# Patient Record
Sex: Male | Born: 1955 | Hispanic: Yes | Smoking: Heavy tobacco smoker
Health system: Southern US, Community
[De-identification: ages and names within clinical notes are randomized; demographics above are authoritative.]

## PROBLEM LIST (undated history)

## (undated) DIAGNOSIS — I10 Essential (primary) hypertension: Secondary | ICD-10-CM

## (undated) DIAGNOSIS — F1910 Other psychoactive substance abuse, uncomplicated: Secondary | ICD-10-CM

---
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
## 2020-10-10 ENCOUNTER — Encounter (HOSPITAL_COMMUNITY): Payer: Self-pay | Admitting: Emergency Medicine

## 2020-10-10 ENCOUNTER — Emergency Department (HOSPITAL_COMMUNITY)
Admission: EM | Admit: 2020-10-10 | Discharge: 2020-10-11 | Disposition: A | Discharge status: Home / Self Care | Payer: Self-pay | Attending: Emergency Medicine | Admitting: Emergency Medicine

## 2020-10-10 ENCOUNTER — Other Ambulatory Visit: Payer: Self-pay

## 2020-10-10 DIAGNOSIS — Z5321 Procedure and treatment not carried out due to patient leaving prior to being seen by health care provider: Secondary | ICD-10-CM | POA: Insufficient documentation

## 2020-10-10 DIAGNOSIS — R55 Syncope and collapse: Secondary | ICD-10-CM | POA: Insufficient documentation

## 2020-10-10 LAB — CBC WITH DIFFERENTIAL/PLATELET
Abs Immature Granulocytes: 0.01 10*3/uL (ref 0.00–0.07)
Basophils Absolute: 0 10*3/uL (ref 0.0–0.1)
Basophils Relative: 1 %
Eosinophils Absolute: 0.1 10*3/uL (ref 0.0–0.5)
Eosinophils Relative: 2 %
HCT: 42 % (ref 39.0–52.0)
Hemoglobin: 13 g/dL (ref 13.0–17.0)
Immature Granulocytes: 0 %
Lymphocytes Relative: 27 %
Lymphs Abs: 1.8 10*3/uL (ref 0.7–4.0)
MCH: 25.9 pg — ABNORMAL LOW (ref 26.0–34.0)
MCHC: 31 g/dL (ref 30.0–36.0)
MCV: 83.8 fL (ref 80.0–100.0)
Monocytes Absolute: 0.5 10*3/uL (ref 0.1–1.0)
Monocytes Relative: 8 %
Neutro Abs: 4.2 10*3/uL (ref 1.7–7.7)
Neutrophils Relative %: 62 %
Platelets: 199 10*3/uL (ref 150–400)
RBC: 5.01 MIL/uL (ref 4.22–5.81)
RDW: 14.2 % (ref 11.5–15.5)
WBC: 6.6 10*3/uL (ref 4.0–10.5)
nRBC: 0 % (ref 0.0–0.2)

## 2020-10-10 LAB — COMPREHENSIVE METABOLIC PANEL
ALT: 31 U/L (ref 0–44)
AST: 33 U/L (ref 15–41)
Albumin: 4.1 g/dL (ref 3.5–5.0)
Alkaline Phosphatase: 75 U/L (ref 38–126)
Anion gap: 11 (ref 5–15)
BUN: 21 mg/dL (ref 8–23)
CO2: 25 mmol/L (ref 22–32)
Calcium: 9.1 mg/dL (ref 8.9–10.3)
Chloride: 105 mmol/L (ref 98–111)
Creatinine, Ser: 1.14 mg/dL (ref 0.61–1.24)
GFR, Estimated: >60 mL/min (ref >60)
Glucose, Bld: 114 mg/dL — ABNORMAL HIGH (ref 70–99)
Potassium: 3.8 mmol/L (ref 3.5–5.1)
Sodium: 141 mmol/L (ref 135–145)
Total Bilirubin: 0.4 mg/dL (ref 0.3–1.2)
Total Protein: 7.5 g/dL (ref 6.5–8.1)

## 2020-10-10 NOTE — ED Triage Notes (Signed)
Patient arrived with EMS from home reports brief syncopal episode at home this evening with mild dizziness/lightheaded , CBG= 127 , alert and oriented, denies pain /respirations unlabored.

## 2020-10-11 NOTE — ED Notes (Signed)
Patient called x 2 with no response. 

## 2020-10-12 ENCOUNTER — Encounter (HOSPITAL_COMMUNITY): Payer: Self-pay | Admitting: Emergency Medicine

## 2020-10-12 ENCOUNTER — Observation Stay (HOSPITAL_COMMUNITY): Payer: Self-pay

## 2020-10-12 ENCOUNTER — Inpatient Hospital Stay (HOSPITAL_COMMUNITY)
Admission: EM | Admit: 2020-10-12 | Discharge: 2020-10-15 | DRG: 312 | Disposition: A | Discharge status: Home / Self Care | Payer: Self-pay | Attending: Family Medicine | Admitting: Family Medicine

## 2020-10-12 ENCOUNTER — Emergency Department (HOSPITAL_COMMUNITY): Payer: Self-pay

## 2020-10-12 ENCOUNTER — Other Ambulatory Visit: Payer: Self-pay

## 2020-10-12 ENCOUNTER — Ambulatory Visit (HOSPITAL_COMMUNITY)
Admission: RE | Admit: 2020-10-12 | Discharge: 2020-10-12 | Disposition: A | Discharge status: Home / Self Care | Payer: Self-pay | Source: Ambulatory Visit

## 2020-10-12 DIAGNOSIS — F1027 Alcohol dependence with alcohol-induced persisting dementia: Secondary | ICD-10-CM | POA: Diagnosis present

## 2020-10-12 DIAGNOSIS — F17210 Nicotine dependence, cigarettes, uncomplicated: Secondary | ICD-10-CM | POA: Diagnosis present

## 2020-10-12 DIAGNOSIS — R7689 Other specified abnormal immunological findings in serum: Secondary | ICD-10-CM

## 2020-10-12 DIAGNOSIS — F0390 Unspecified dementia without behavioral disturbance: Secondary | ICD-10-CM | POA: Diagnosis present

## 2020-10-12 DIAGNOSIS — R413 Other amnesia: Secondary | ICD-10-CM | POA: Diagnosis present

## 2020-10-12 DIAGNOSIS — F0150 Vascular dementia without behavioral disturbance: Secondary | ICD-10-CM | POA: Diagnosis present

## 2020-10-12 DIAGNOSIS — F1911 Other psychoactive substance abuse, in remission: Secondary | ICD-10-CM

## 2020-10-12 DIAGNOSIS — R55 Syncope and collapse: Principal | ICD-10-CM | POA: Diagnosis present

## 2020-10-12 DIAGNOSIS — Z20822 Contact with and (suspected) exposure to covid-19: Secondary | ICD-10-CM | POA: Diagnosis present

## 2020-10-12 DIAGNOSIS — B1920 Unspecified viral hepatitis C without hepatic coma: Secondary | ICD-10-CM | POA: Diagnosis present

## 2020-10-12 DIAGNOSIS — R768 Other specified abnormal immunological findings in serum: Secondary | ICD-10-CM

## 2020-10-12 DIAGNOSIS — E785 Hyperlipidemia, unspecified: Secondary | ICD-10-CM | POA: Diagnosis present

## 2020-10-12 DIAGNOSIS — E538 Deficiency of other specified B group vitamins: Secondary | ICD-10-CM | POA: Diagnosis present

## 2020-10-12 DIAGNOSIS — I10 Essential (primary) hypertension: Secondary | ICD-10-CM | POA: Diagnosis present

## 2020-10-12 DIAGNOSIS — Z8249 Family history of ischemic heart disease and other diseases of the circulatory system: Secondary | ICD-10-CM

## 2020-10-12 DIAGNOSIS — Z0000 Encounter for general adult medical examination without abnormal findings: Secondary | ICD-10-CM

## 2020-10-12 HISTORY — DX: Other psychoactive substance abuse, uncomplicated: F19.10

## 2020-10-12 HISTORY — DX: Syncope and collapse: R55

## 2020-10-12 LAB — HEPATIC FUNCTION PANEL
ALT: 30 U/L (ref 0–44)
AST: 34 U/L (ref 15–41)
Albumin: 3.9 g/dL (ref 3.5–5.0)
Alkaline Phosphatase: 67 U/L (ref 38–126)
Bilirubin, Direct: 0.1 mg/dL (ref 0.0–0.2)
Indirect Bilirubin: 0.6 mg/dL (ref 0.3–0.9)
Total Bilirubin: 0.7 mg/dL (ref 0.3–1.2)
Total Protein: 7.3 g/dL (ref 6.5–8.1)

## 2020-10-12 LAB — CBC
HCT: 42.7 % (ref 39.0–52.0)
Hemoglobin: 13.4 g/dL (ref 13.0–17.0)
MCH: 26.4 pg (ref 26.0–34.0)
MCHC: 31.4 g/dL (ref 30.0–36.0)
MCV: 84.1 fL (ref 80.0–100.0)
Platelets: 216 10*3/uL (ref 150–400)
RBC: 5.08 MIL/uL (ref 4.22–5.81)
RDW: 14.2 % (ref 11.5–15.5)
WBC: 5.6 10*3/uL (ref 4.0–10.5)
nRBC: 0 % (ref 0.0–0.2)

## 2020-10-12 LAB — VITAMIN B12: Vitamin B-12: 204 pg/mL (ref 180–914)

## 2020-10-12 LAB — BASIC METABOLIC PANEL
Anion gap: 11 (ref 5–15)
BUN: 9 mg/dL (ref 8–23)
CO2: 25 mmol/L (ref 22–32)
Calcium: 9.2 mg/dL (ref 8.9–10.3)
Chloride: 103 mmol/L (ref 98–111)
Creatinine, Ser: 1.11 mg/dL (ref 0.61–1.24)
GFR, Estimated: >60 mL/min (ref >60)
Glucose, Bld: 109 mg/dL — ABNORMAL HIGH (ref 70–99)
Potassium: 3.6 mmol/L (ref 3.5–5.1)
Sodium: 139 mmol/L (ref 135–145)

## 2020-10-12 LAB — URINALYSIS, ROUTINE W REFLEX MICROSCOPIC
Bilirubin Urine: NEGATIVE
Glucose, UA: NEGATIVE mg/dL
Hgb urine dipstick: NEGATIVE
Ketones, ur: NEGATIVE mg/dL
Leukocytes,Ua: NEGATIVE
Nitrite: NEGATIVE
Protein, ur: NEGATIVE mg/dL
Specific Gravity, Urine: 1.003 — ABNORMAL LOW (ref 1.005–1.030)
pH: 6 (ref 5.0–8.0)

## 2020-10-12 LAB — RAPID URINE DRUG SCREEN, HOSP PERFORMED
Amphetamines: NOT DETECTED
Barbiturates: NOT DETECTED
Benzodiazepines: NOT DETECTED
Cocaine: NOT DETECTED
Opiates: NOT DETECTED
Tetrahydrocannabinol: NOT DETECTED

## 2020-10-12 LAB — HEPATITIS PANEL, ACUTE
HCV Ab: REACTIVE — AB
Hep A IgM: NONREACTIVE
Hep B C IgM: NONREACTIVE
Hepatitis B Surface Ag: NONREACTIVE

## 2020-10-12 LAB — RESPIRATORY PANEL BY RT PCR (FLU A&B, COVID)
Influenza A by PCR: NEGATIVE
Influenza B by PCR: NEGATIVE
SARS Coronavirus 2 by RT PCR: NEGATIVE

## 2020-10-12 LAB — ETHANOL: Alcohol, Ethyl (B): <10 mg/dL (ref <10)

## 2020-10-12 LAB — TROPONIN I (HIGH SENSITIVITY)
Troponin I (High Sensitivity): 3 ng/L (ref <18)
Troponin I (High Sensitivity): 3 ng/L (ref <18)

## 2020-10-12 LAB — HIV ANTIBODY (ROUTINE TESTING W REFLEX): HIV Screen 4th Generation wRfx: NONREACTIVE

## 2020-10-12 MED ORDER — ACETAMINOPHEN 325 MG PO TABS
650.0000 mg | ORAL_TABLET | Freq: Four times a day (QID) | ORAL | Status: DC | PRN
  Administered 2020-10-14 – 2020-10-15 (×2): 650 mg via ORAL
  Filled 2020-10-12 (×2): qty 2

## 2020-10-12 MED ORDER — ACETAMINOPHEN 650 MG RE SUPP: 650.0000 mg | Freq: Four times a day (QID) | RECTAL | Status: DC | PRN

## 2020-10-12 MED ORDER — SODIUM CHLORIDE 0.9% FLUSH
3.0000 mL | Freq: Two times a day (BID) | INTRAVENOUS | Status: DC
  Administered 2020-10-12 – 2020-10-15 (×6): 3 mL via INTRAVENOUS

## 2020-10-12 MED ORDER — ADULT MULTIVITAMIN W/MINERALS CH
1.0000 | ORAL_TABLET | Freq: Every day | ORAL | Status: DC
  Administered 2020-10-13 – 2020-10-15 (×3): 1 via ORAL
  Filled 2020-10-12 (×3): qty 1

## 2020-10-12 MED ORDER — POLYETHYLENE GLYCOL 3350 17 G PO PACK: 17.0000 g | PACK | Freq: Every day | ORAL | Status: DC | PRN

## 2020-10-12 MED ORDER — ENOXAPARIN SODIUM 40 MG/0.4ML ~~LOC~~ SOLN
40.0000 mg | SUBCUTANEOUS | Status: DC
  Administered 2020-10-12 – 2020-10-14 (×3): 40 mg via SUBCUTANEOUS
  Filled 2020-10-12 (×3): qty 0.4

## 2020-10-12 MED ORDER — AMLODIPINE BESYLATE 5 MG PO TABS
5.0000 mg | ORAL_TABLET | Freq: Every day | ORAL | Status: DC
  Administered 2020-10-12 – 2020-10-14 (×3): 5 mg via ORAL
  Filled 2020-10-12 (×3): qty 1

## 2020-10-12 MED ORDER — NICOTINE 14 MG/24HR TD PT24
14.0000 mg | MEDICATED_PATCH | Freq: Every day | TRANSDERMAL | Status: DC
  Administered 2020-10-12 – 2020-10-15 (×4): 14 mg via TRANSDERMAL
  Filled 2020-10-12 (×4): qty 1

## 2020-10-12 NOTE — ED Triage Notes (Signed)
Pt presents with memory loss and episode of fainting with loss of consciousness on Monday. States called EMS and was transported to [facility] but left due to wait time.   Patient is being discharged from the Urgent Care and sent to the Emergency Department via POV . Per Dr Tracie Harrier, patient is in need of higher level of care due to need of further imaging. Patient is aware and verbalizes understanding of plan of care.  Vitals:   10/12/20 1226  BP: 129/80  Pulse: 66  Resp: 17  Temp: 98.2 F (36.8 C)  SpO2: 100%
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
## 2020-10-12 NOTE — ED Provider Notes (Signed)
[facility] EMERGENCY DEPARTMENT Provider Note   CSN: [id number] Arrival date & time: 10/12/20  1250     History Chief Complaint  Patient presents with  . Loss of Consciousness    Rene Pacismael Allene Dillonorres is a 64 y.o. male with past medical history of hypertension that presents the emerge department today with ex-wife for syncopal episode on Monday night.  Came to the emergency department Monday night, but  left due to wait time, went to urgent care today who advised him to come to the emergency department for further evaluation.  When speaking to patient and asking him why he is here, he states that he is unsure.  Ex-wife who is present states that he has been suffering from memory loss for the past year, has not seen a doctor in the past 12 years.  Patient denies pain anywhere, is a&o x4.  He is able to answer questions appropriately, however does jump to saying I do not know before being able to respond appropriately.  Ex-Wife States [place] here from [place] a couple months ago, has been living with her since then.  States that patient has been having these syncopal episodes for the past year, has about 4-5 so far, last 1 being Monday night.  Most of them have been unwitnessed except for the 1 Monday night.  Ex-wife states that he had slumped over and was nonresponsive for a couple of minutes, states that he was not back to baseline for over 10 minutes.  States that during the time his  R fist was clenched and arm was extended.  States that he kept yawning after this happened and was still unresponsive for the next 10 minutes.  When speaking to patient about this he states that he does not remember this, however states that this is happened to him a couple times in the past year, did hit his head during those times, however on Monday ex-wife states that he was sitting in a chair and did not hit his head.  Patient is not complaining of anything currently, no headache, vision  changes, numbness, tingling.  No facial droop, speech difficulty , weakness after coming back to baseline.  No tonic-clonic jerks.  Patient has never been diagnosed with seizures before, no seizure history running in the family.  Denies any alcohol use, IV drug use.  Denies any fevers, neck pain, confusion.  Memory loss has been occurring for the past year.  No history of dementia.  HPI     Past Medical History:  Diagnosis Date  . Hypertension     There are no problems to display for this patient.   History reviewed. No pertinent surgical history.     No family history on file.  Social History   Tobacco Use  . Smoking status: Never Smoker  . Smokeless tobacco: Never Used  Substance Use Topics  . Alcohol use: Never  . Drug use: Never    Home Medications Prior to Admission medications   Not on File    Allergies    Patient has no known allergies.  Review of Systems   Review of Systems  Constitutional: Negative for chills, diaphoresis, fatigue and fever.  HENT: Negative for congestion, sore throat and trouble swallowing.   Eyes: Negative for pain and visual disturbance.  Respiratory: Negative for cough, shortness of breath and wheezing.   Cardiovascular: Negative for chest pain, palpitations and leg swelling.  Gastrointestinal: Negative for abdominal distention, abdominal pain, diarrhea, nausea and vomiting.  Genitourinary: Negative for difficulty urinating.  Musculoskeletal: Negative for back pain, neck pain and neck stiffness.  Skin: Negative for pallor.  Neurological: Positive for syncope. Negative for dizziness, speech difficulty, weakness, light-headedness, numbness and headaches.  Psychiatric/Behavioral: Negative for confusion.    Physical Exam Updated Vital Signs BP (!) 160/105 (BP Location: Right Arm)   Pulse 61   Temp 98.7 F (37.1 C) (Oral)   Resp 18   Ht 5\' 7"  (1.702 m)   Wt 70.3 kg   SpO2 100%   BMI 24.28 kg/m   Physical Exam Constitutional:       General: He is not in acute distress.    Appearance: Normal appearance. He is not ill-appearing, toxic-appearing or diaphoretic.  HENT:     Mouth/Throat:     Mouth: Mucous membranes are moist.     Pharynx: Oropharynx is clear.  Eyes:     General: No scleral icterus.    Extraocular Movements: Extraocular movements intact.     Pupils: Pupils are equal, round, and reactive to light.  Cardiovascular:     Rate and Rhythm: Normal rate and regular rhythm.     Pulses: Normal pulses.     Heart sounds: Normal heart sounds.  Pulmonary:     Effort: Pulmonary effort is normal. No respiratory distress.     Breath sounds: Normal breath sounds. No stridor. No wheezing, rhonchi or rales.  Chest:     Chest wall: No tenderness.  Abdominal:     General: Abdomen is flat. There is no distension.     Palpations: Abdomen is soft.     Tenderness: There is no abdominal tenderness. There is no guarding or rebound.  Musculoskeletal:        General: No swelling or tenderness. Normal range of motion.     Cervical back: Normal range of motion and neck supple. No rigidity.     Right lower leg: No edema.     Left lower leg: No edema.  Skin:    General: Skin is warm and dry.     Capillary Refill: Capillary refill takes less than 2 seconds.     Coloration: Skin is not pale.  Neurological:     General: No focal deficit present.     Mental Status: He is alert and oriented to person, place, and time.     Comments: Alert. Clear speech. No facial droop. CNIII-XII grossly intact. Bilateral upper and lower extremities' sensation grossly intact. 5/5 symmetric strength with grip strength and with plantar and dorsi flexion bilaterally. Normal finger to nose bilaterally. Negative pronator drift. Negative Romberg sign. Gait is steady and intact    Psychiatric:        Mood and Affect: Mood normal.        Behavior: Behavior normal.     ED Results / Procedures / Treatments   Labs (all labs ordered are listed, but  only abnormal results are displayed) Labs Reviewed  BASIC METABOLIC PANEL - Abnormal; Notable for the following components:      Result Value   Glucose, Bld 109 (*)    All other components within normal limits  URINALYSIS, ROUTINE W REFLEX MICROSCOPIC - Abnormal; Notable for the following components:   Color, Urine STRAW (*)    Specific Gravity, Urine 1.003 (*)    All other components within normal limits  RESPIRATORY PANEL BY RT PCR (FLU A&B, COVID)  CBC  RAPID URINE DRUG SCREEN, HOSP PERFORMED  HEPATIC FUNCTION PANEL  CBG MONITORING, ED  TROPONIN I (  HIGH SENSITIVITY)    EKG EKG Interpretation  Date/Time:  Wednesday October 12 2020 12:56:01 EDT Ventricular Rate:  69 PR Interval:  144 QRS Duration: 92 QT Interval:  392 QTC Calculation: 420 R Axis:   16 Text Interpretation: Normal sinus rhythm Cannot rule out Anterior infarct , age undetermined Confirmed by Virgina Norfolk [phone]) on 10/12/2020 2:11:38 PM Also confirmed by Virgina Norfolk [phone])  on 10/12/2020 2:11:55 PM   Radiology DG Chest 2 View  Result Date: 10/12/2020 CLINICAL DATA:  Syncope, cough EXAM: CHEST - 2 VIEW COMPARISON:  None. FINDINGS: Lungs are well expanded, symmetric, and clear. No pneumothorax or pleural effusion. Cardiac size within normal limits. Pulmonary vascularity is normal. Osseous structures are age-appropriate. No acute bone abnormality. IMPRESSION: No active cardiopulmonary disease. Electronically Signed   By: Helyn Numbers MD   On: 10/12/2020 14:33   CT HEAD WO CONTRAST  Result Date: 10/12/2020 CLINICAL DATA:  Delirium.  Passed out and hit back of head EXAM: CT HEAD WITHOUT CONTRAST TECHNIQUE: Contiguous axial images were obtained from the base of the skull through the vertex without intravenous contrast. COMPARISON:  None. FINDINGS: Brain: No evidence of acute infarction, hemorrhage, hydrocephalus, extra-axial collection or mass lesion/mass effect. Vascular: Calcific atherosclerosis. Skull: No  acute fracture. Sinuses/Orbits: The sinuses are clear.  Unremarkable orbits. Other: No mastoid effusions. Cerumen in the right external auditory canal. IMPRESSION: No evidence of acute intracranial abnormality. Electronically Signed   By: Feliberto Harts MD   On: 10/12/2020 14:18    Procedures Procedures (including critical care time)  Medications Ordered in ED Medications - No data to display  ED Course  I have reviewed the triage vital signs and the nursing notes.  Pertinent labs & imaging results that were available during my care of the patient were reviewed by me and considered in my medical decision making (see chart for details).    MDM Rules/Calculators/A&P                         Cayetano Ginley is a 64 y.o. male with past medical history of hypertension that presents the emerge department today with ex-wife for syncopal episode on Monday night.  Will obtain work-up for syncopal episode, patient has normal neuro exam.  Description of syncopal episode questionable for seizure, but could also be cardiac in origin, however I think patient most likely needs further evaluation for recurrent syncopal episodes including echo and tele.   Work-up today with unremarkable BMP, CBC and urinalysis.  EKG interpreted by me with no acute ischemia or arrhythmia, chest x-ray interpreted me with no acute cardiopulmonary disease.  CT head without any pathology.  Will consult hospitalist this time for unknown syncope work-up.  330 spoke to Dr. Robyne Peers, family medicine resident that will accept the patient.  The patient appears reasonably stabilized for admission considering the current resources, flow, and capabilities available in the ED at this time, and I doubt any other [facility] requiring further screening and/or treatment in the ED prior to admission.  I discussed this case with my attending physician who cosigned this note including patient's presenting symptoms, physical exam, and planned diagnostics  and interventions. Attending physician stated agreement with plan or made changes to plan which were implemented.   Attending physician assessed patient at bedside.    Final Clinical Impression(s) / ED Diagnoses Final diagnoses:  Syncope, unspecified syncope type    Rx / DC Orders ED Discharge Orders    None  Farrel Gordon, PA-C 10/12/20 1534    Virgina Norfolk, DO 10/12/20 1538

## 2020-10-12 NOTE — ED Triage Notes (Signed)
Pt was seen here Monday night for syncopal episode, wife states that patient became unresponsive and began vomiting. States pt was out approx 5-10 mins, EMS brought patient in to ED but never seen by doctor due to wait time. Went to urgent care today who sent patient down for further evaluation. Pt alert, oriented to person, place disoriented to time.

## 2020-10-12 NOTE — ED Notes (Signed)
Admitting at bedside 

## 2020-10-13 ENCOUNTER — Observation Stay (HOSPITAL_COMMUNITY): Payer: Self-pay

## 2020-10-13 ENCOUNTER — Encounter (HOSPITAL_COMMUNITY): Payer: Self-pay | Admitting: Family Medicine

## 2020-10-13 DIAGNOSIS — R768 Other specified abnormal immunological findings in serum: Secondary | ICD-10-CM

## 2020-10-13 DIAGNOSIS — I340 Nonrheumatic mitral (valve) insufficiency: Secondary | ICD-10-CM

## 2020-10-13 DIAGNOSIS — F0390 Unspecified dementia without behavioral disturbance: Secondary | ICD-10-CM | POA: Diagnosis present

## 2020-10-13 DIAGNOSIS — R413 Other amnesia: Secondary | ICD-10-CM | POA: Diagnosis present

## 2020-10-13 DIAGNOSIS — I361 Nonrheumatic tricuspid (valve) insufficiency: Secondary | ICD-10-CM

## 2020-10-13 DIAGNOSIS — R55 Syncope and collapse: Secondary | ICD-10-CM

## 2020-10-13 DIAGNOSIS — F1027 Alcohol dependence with alcohol-induced persisting dementia: Secondary | ICD-10-CM | POA: Diagnosis present

## 2020-10-13 DIAGNOSIS — I10 Essential (primary) hypertension: Secondary | ICD-10-CM

## 2020-10-13 HISTORY — DX: Essential (primary) hypertension: I10

## 2020-10-13 LAB — ECHOCARDIOGRAM COMPLETE
Area-P 1/2: 1.98 cm2
Height: 67 in
S' Lateral: 3.7 cm
Weight: 2480 oz

## 2020-10-13 LAB — COMPREHENSIVE METABOLIC PANEL
ALT: 25 U/L (ref 0–44)
AST: 26 U/L (ref 15–41)
Albumin: 3.5 g/dL (ref 3.5–5.0)
Alkaline Phosphatase: 69 U/L (ref 38–126)
Anion gap: 7 (ref 5–15)
BUN: 10 mg/dL (ref 8–23)
CO2: 26 mmol/L (ref 22–32)
Calcium: 8.7 mg/dL — ABNORMAL LOW (ref 8.9–10.3)
Chloride: 105 mmol/L (ref 98–111)
Creatinine, Ser: 1.02 mg/dL (ref 0.61–1.24)
GFR, Estimated: >60 mL/min (ref >60)
Glucose, Bld: 99 mg/dL (ref 70–99)
Potassium: 3.9 mmol/L (ref 3.5–5.1)
Sodium: 138 mmol/L (ref 135–145)
Total Bilirubin: 0.7 mg/dL (ref 0.3–1.2)
Total Protein: 6.7 g/dL (ref 6.5–8.1)

## 2020-10-13 LAB — RPR: RPR Ser Ql: NONREACTIVE

## 2020-10-13 LAB — CBC
HCT: 38.6 % — ABNORMAL LOW (ref 39.0–52.0)
Hemoglobin: 12.2 g/dL — ABNORMAL LOW (ref 13.0–17.0)
MCH: 26.2 pg (ref 26.0–34.0)
MCHC: 31.6 g/dL (ref 30.0–36.0)
MCV: 82.8 fL (ref 80.0–100.0)
Platelets: 181 10*3/uL (ref 150–400)
RBC: 4.66 MIL/uL (ref 4.22–5.81)
RDW: 14.2 % (ref 11.5–15.5)
WBC: 5.7 10*3/uL (ref 4.0–10.5)
nRBC: 0 % (ref 0.0–0.2)

## 2020-10-13 LAB — LIPID PANEL
Cholesterol: 177 mg/dL (ref 0–200)
HDL: 43 mg/dL (ref >40)
LDL Cholesterol: 121 mg/dL — ABNORMAL HIGH (ref 0–99)
Total CHOL/HDL Ratio: 4.1 RATIO
Triglycerides: 67 mg/dL (ref <150)
VLDL: 13 mg/dL (ref 0–40)

## 2020-10-13 LAB — VITAMIN B12: Vitamin B-12: 184 pg/mL (ref 180–914)

## 2020-10-13 LAB — AMMONIA: Ammonia: 34 umol/L (ref 9–35)

## 2020-10-13 LAB — TSH: TSH: 1.689 u[IU]/mL (ref 0.350–4.500)

## 2020-10-13 LAB — FOLATE: Folate: 16.5 ng/mL (ref >5.9)

## 2020-10-13 NOTE — Progress Notes (Signed)
Family Medicine Teaching Service Daily Progress Note Intern Pager: [phone]  Patient name: Chad Daniel Medical record number: [id number] Date of birth: February 21, 1956 Age: 64 y.o. Gender: male  Primary Care Provider: Patient, No Pcp Per Consultants: Neurology Code Status: Full  Pt Overview and Major Events to Date:  Admitted 10/20  Assessment and Plan: Chad Daniel is a 64 y.o. male presenting with syncope. PMH is significant for hypertension.  Syncopal episode  Patient presents with history of multiple syncopal episodes over the last year or 2, most recently about 2 days ago involving a witnessed episode of slumping in the chair while getting a haircut lasting about 10 minutes. CBC, BMP and UA unremarkable. CXR showed no active cardiopulmonary disease. CT unremarlable. MRI normal.  EKG NSR. Tropnonin's negative. Patient not hypoglycemic or uremic.  Patient with remote history of substance abuse however UDS was negative. Neurology consulted, appreciate recs.  -am CBC and CMP - EEG normal  - f/u echo results -Follow-up orthostatic vitals  Memory Loss Per ex-wife, patient with rapid decline in the patient's memory since June/July.  Patient admits he has been forgetful and often forgets things like where to put stuff. A&Ox3. CT head was negative for acute intracranial process.  Given patient's reported diet, consider nutritional deficiencies. Concern for vascular dementia given these multiple episodes and rapid confusion that has worsened in the past 3-4 months. MRI normal.  - Neurology consulted, appreciate recs  - folate  - Vit B12 204. MMA ordered confirm -Multivitamin  HTN Patient has not seen a physician in over 12 years. Currently not on any home medications. BP on admission is 160/105. Used to be on BP meds but stopped taking them over a year ago.  - Start amlodipine 5 mg daily   Tobacco use  Reports smoking 1 pack every couple of days.   -nicotine patch 14 mg  History  of substance abuse Patient reports abstinence for greater than 10 years.  Admits to IV drug use in the past.  UDS negative. - Hep C positive. F/u NAAT   FEN/GI: heart healthy diet  Prophylaxis: Lovenox   Disposition: cardiac telemetry   Subjective:   No acute events overnight. Patient getting tearful when talking to me stating he is worried about these episodes of passing out and does not want to fall and hit his head. He states he stopped taking his BP medication over a year ago because he felt he was doing ok without it. Denies dizziness, lightheadedness, chest pain, SOB.   Objective: Temp:  [98.7 F (37.1 C)] 98.7 F (37.1 C) (10/20 1254) Pulse Rate:  [49-61] 57 (10/21 0400) Resp:  [14-19] 15 (10/21 0400) BP: (109-160)/(70-105) 128/77 (10/21 0400) SpO2:  [98 %-100 %] 99 % (10/21 0400) Weight:  [70.3 kg] 70.3 kg (10/20 1254) Physical Exam: General: Pleasant, NAD Cardiovascular: RRR. No murmurs  Respiratory: CTAB. Normal WOB Abdomen: soft, non distended, non tender  Extremities: warm, dry. No edema. Distal pulses 2+  Laboratory: Recent Labs  Lab 10/10/20 1951 10/12/20 1308 10/13/20 0218  WBC 6.6 5.6 5.7  HGB 13.0 13.4 12.2*  HCT 42.0 42.7 38.6*  PLT 199 216 181   Recent Labs  Lab 10/10/20 1951 10/12/20 1308 10/12/20 1619 10/13/20 0218  NA 141 139  --  138  K 3.8 3.6  --  3.9  CL 105 103  --  105  CO2 25 25  --  26  BUN 21 9  --  10  CREATININE 1.14 1.11  --  1.02  CALCIUM 9.1 9.2  --  8.7*  PROT 7.5  --  7.3 6.7  BILITOT 0.4  --  0.7 0.7  ALKPHOS 75  --  67 69  ALT 31  --  30 25  AST 33  --  34 26  GLUCOSE 114* 109*  --  99     Imaging/Diagnostic Tests:  DG Chest 2 View Result Date: 10/12/2020 No active cardiopulmonary disease.   CT HEAD WO CONTRAST Result Date: 10/12/2020  No evidence of acute intracranial abnormality.  MR BRAIN WO CONTRAST Result Date: 10/12/2020  Normal for age noncontrast MRI appearance of the brain.    Cora Collum, DO 10/13/2020, 5:31 AM PGY-1, [place] [facility] FPTS Intern pager: [phone], text pages welcome

## 2020-10-13 NOTE — Progress Notes (Signed)
  Echocardiogram 2D Echocardiogram has been performed.  Pieter Partridge 10/13/2020, 10:32 AM

## 2020-10-13 NOTE — Plan of Care (Signed)
  Problem: Education: Goal: Knowledge of condition and prescribed therapy will improve Outcome: Progressing   Problem: Cardiac: Goal: Will achieve and/or maintain adequate cardiac output Outcome: Progressing   Problem: Physical Regulation: Goal: Complications related to the disease process, condition or treatment will be avoided or minimized Outcome: Progressing   Problem: Education: Goal: Expressions of having a comfortable level of knowledge regarding the disease process will increase Outcome: Progressing   Problem: Coping: Goal: Ability to adjust to condition or change in health will improve Outcome: Progressing Goal: Ability to identify appropriate support needs will improve Outcome: Progressing   Problem: Health Behavior/Discharge Planning: Goal: Compliance with prescribed medication regimen will improve Outcome: Progressing   Problem: Medication: Goal: Risk for medication side effects will decrease Outcome: Progressing   Problem: Clinical Measurements: Goal: Complications related to the disease process, condition or treatment will be avoided or minimized Outcome: Progressing Goal: Diagnostic test results will improve Outcome: Progressing   Problem: Safety: Goal: Verbalization of understanding the information provided will improve Outcome: Progressing   Problem: Self-Concept: Goal: Level of anxiety will decrease Outcome: Progressing Goal: Ability to verbalize feelings about condition will improve Outcome: Progressing   Problem: Education: Goal: Knowledge of General Education information will improve Description: Including pain rating scale, medication(s)/side effects and non-pharmacologic comfort measures Outcome: Progressing   Problem: Health Behavior/Discharge Planning: Goal: Ability to manage health-related needs will improve Outcome: Progressing   Problem: Clinical Measurements: Goal: Ability to maintain clinical measurements within normal limits will  improve Outcome: Progressing Goal: Will remain free from infection Outcome: Progressing Goal: Diagnostic test results will improve Outcome: Progressing Goal: Respiratory complications will improve Outcome: Progressing Goal: Cardiovascular complication will be avoided Outcome: Progressing   Problem: Activity: Goal: Risk for activity intolerance will decrease Outcome: Progressing   Problem: Nutrition: Goal: Adequate nutrition will be maintained Outcome: Progressing   Problem: Coping: Goal: Level of anxiety will decrease Outcome: Progressing   Problem: Elimination: Goal: Will not experience complications related to bowel motility Outcome: Progressing Goal: Will not experience complications related to urinary retention Outcome: Progressing   Problem: Pain Managment: Goal: General experience of comfort will improve Outcome: Progressing   Problem: Safety: Goal: Ability to remain free from injury will improve Outcome: Progressing   Problem: Skin Integrity: Goal: Risk for impaired skin integrity will decrease Outcome: Progressing

## 2020-10-13 NOTE — Progress Notes (Signed)
EEG complete - results pending 

## 2020-10-13 NOTE — Progress Notes (Signed)
Lab called they reported did not need further blood collection had drawn all the blood needed for labs and have resulted them.

## 2020-10-13 NOTE — Procedures (Signed)
Patient Name: Chad Daniel  MRN: [id number]  Epilepsy Attending: Charlsie Quest  Referring Physician/Provider:  Dr Katha Cabal Date: 1021/2021 Duration: 25.39 mins  Patient history: 64 year old male with syncope.  EEG to evaluate for seizures.  Level of alertness: Awake  AEDs during EEG study: None  Technical aspects: This EEG study was done with scalp electrodes positioned according to the 10-20 International system of electrode placement. Electrical activity was acquired at a sampling rate of 500Hz  and reviewed with a high frequency filter of 70Hz  and a low frequency filter of 1Hz . EEG data were recorded continuously and digitally stored.   Description: The posterior dominant rhythm consists of 8 Hz activity of moderate voltage (25-35 uV) seen predominantly in posterior head regions, symmetric and reactive to eye opening and eye closing. Hyperventilation and photic stimulation were not performed.     IMPRESSION: This study is within normal limits. No seizures or epileptiform discharges were seen throughout the recording.  Chad Daniel 

## 2020-10-13 NOTE — Consult Note (Addendum)
Neurology Consultation  Reason for Consult: Syncope versus seizure and memory loss Referring Physician: McDiarmid, Leighton Roach, MD  CC: Syncopal episode  History is obtained from: Wife and daughter  HPI: Krist Rosenboom is a 64 y.o. male history of IV drug abuse multiple years ago along with essential hypertension.  Per daughter this past Monday patient was getting a haircut when he passed out and hit his head on the table.  There was associated vomiting.  During this period of time it was noted that he was breathing very heavy.  Apparently he was talking right away but seemed confused for approximately 8 minutes.  Although patient is not a good historian he remembers that he did live and [place] and stated during that period of time he feels as though he is passed out 2 or maybe 3 times previously.  Another concern family had is that over the past year he is seem to significant memory problems.  These include forgetting to eat, forgetting to shower, forgetting take medications.  Due to these issues he was moved from [place] to [place] to be watched more closely.  Wife states that over the past 6 months his memory has significantly declined needing for her to take over finances.   No family history of dementia.  Daughter in the room did not know much about the family history and neither did the patient. He does endorse doing a lot of drugs in his younger years but denies any drug abuse recently.  Denies alcohol use.  Endorses tobacco smoking.  Neurological consultation obtained for the syncopal episode as well as memory issues as the patient does not have insurance and outpatient follow-up was not reliable.  According to his ex-wife who he is living with, he has not seen a doctor in at least the last 10 to 15 years.  Patient denies any psychiatric illness in the past.  But he is an unreliable historian.  Also unable to obtain any family history of psychiatric disease.   Past Medical  History:  Diagnosis Date  . Essential hypertension 10/13/2020  . IV drug abuse, History of     Family History  Problem Relation Age of Onset  . Hypertension Mother   . Hypertension Father      Social History:   reports that he has never smoked. He has never used smokeless tobacco. He reports that he does not drink alcohol and does not use drugs.  Medications  Current Facility-Administered Medications:  .  acetaminophen (TYLENOL) tablet 650 mg, 650 mg, Oral, Q6H PRN **OR** acetaminophen (TYLENOL) suppository 650 mg, 650 mg, Rectal, Q6H PRN, Ganta, Anupa, DO .  amLODipine (NORVASC) tablet 5 mg, 5 mg, Oral, Daily, Ganta, Anupa, DO, 5 mg at 10/13/20 1053 .  enoxaparin (LOVENOX) injection 40 mg, 40 mg, Subcutaneous, Q24H, Ganta, Anupa, DO, 40 mg at 10/12/20 1852 .  multivitamin with minerals tablet 1 tablet, 1 tablet, Oral, Daily, Ganta, Anupa, DO, 1 tablet at 10/13/20 1052 .  nicotine (NICODERM CQ - dosed in mg/24 hours) patch 14 mg, 14 mg, Transdermal, Daily, Brimage, Vondra, DO, 14 mg at 10/13/20 1050 .  polyethylene glycol (MIRALAX / GLYCOLAX) packet 17 g, 17 g, Oral, Daily PRN, Ganta, Anupa, DO .  sodium chloride flush (NS) 0.9 % injection 3 mL, 3 mL, Intravenous, Q12H, Ganta, Anupa, DO, 3 mL at 10/13/20 1040 No current outpatient medications on file.  ROS:  General ROS: negative for - chills, fatigue, fever, night sweats, weight gain or weight loss Psychological  ROS: Positive for - , memory difficulties Ophthalmic ROS: negative for - blurry vision, double vision, eye pain or loss of vision ENT ROS: negative for - epistaxis, nasal discharge, oral lesions, sore throat, tinnitus or vertigo Allergy and Immunology ROS: negative for - hives or itchy/watery eyes Hematological and Lymphatic ROS: negative for - bleeding problems, bruising or swollen lymph nodes Endocrine ROS: negative for - galactorrhea, hair pattern changes, polydipsia/polyuria or temperature intolerance Respiratory ROS:  negative for - cough, hemoptysis, shortness of breath or wheezing Cardiovascular ROS: negative for - chest pain, dyspnea on exertion, edema or irregular heartbeat Gastrointestinal ROS: negative for - abdominal pain, diarrhea, hematemesis, nausea/vomiting or stool incontinence Genito-Urinary ROS: negative for - dysuria, hematuria, incontinence or urinary frequency/urgency Musculoskeletal ROS: negative for - joint swelling or muscular weakness Neurological ROS: as noted in HPI Dermatological ROS: negative for rash and skin lesion changes  Exam: Current vital signs: BP 128/89   Pulse 87   Temp 98.7 F (37.1 C) (Oral)   Resp 18   Ht 5\' 7"  (1.702 m)   Wt 70.3 kg   SpO2 98%   BMI 24.28 kg/m  Vital signs in last 24 hours: Pulse Rate:  [49-87] 87 (10/21 1330) Resp:  [14-19] 18 (10/21 1330) BP: (104-144)/(70-93) 128/89 (10/21 1200) SpO2:  [97 %-100 %] 98 % (10/21 1330)   Constitutional: Appears well-developed and well-nourished.  Eyes: No scleral injection HENT: No OP obstrucion Head: Normocephalic.  Cardiovascular: Normal rate and regular rhythm.  Respiratory: Effort normal, non-labored breathing GI: Soft.  No distension. There is no tenderness.  Skin: WDI  Neuro: Mental Status: Patient is awake, alert, oriented to person, place, not month, year or situation Speech-she has no dysarthria or aphasia.  Naming, repeating, comprehension is intact.  Able to follow commands without difficulty.  Not able to give a clear and coherent history. Cranial Nerves: II: Visual Fields are full.  III,IV, VI: EOMI without ptosis or diploplia. Pupils equal, round and reactive to light V: Facial sensation is symmetric to temperature VII: Facial movement is symmetric.  VIII: hearing is intact to voice. XII: tongue is midline without atrophy or fasciculations.  Motor: Moving all extremities antigravity with no difficulty. Sensory: Sensation is symmetric to light touch and temperature in the arms  and legs. Cerebellar: FNF and HKS are intact bilaterally  Labs I have reviewed labs in epic and the results pertinent to this consultation are:  CBC    Component Value Date/Time   WBC 5.7 10/13/2020 0218   RBC 4.66 10/13/2020 0218   HGB 12.2 (L) 10/13/2020 0218   HCT 38.6 (L) 10/13/2020 0218   PLT 181 10/13/2020 0218   MCV 82.8 10/13/2020 0218   MCH 26.2 10/13/2020 0218   MCHC 31.6 10/13/2020 0218   RDW 14.2 10/13/2020 0218   LYMPHSABS 1.8 10/10/2020 1951   MONOABS 0.5 10/10/2020 1951   EOSABS 0.1 10/10/2020 1951   BASOSABS 0.0 10/10/2020 1951    CMP     Component Value Date/Time   NA 138 10/13/2020 0218   K 3.9 10/13/2020 0218   CL 105 10/13/2020 0218   CO2 26 10/13/2020 0218   GLUCOSE 99 10/13/2020 0218   BUN 10 10/13/2020 0218   CREATININE 1.02 10/13/2020 0218   CALCIUM 8.7 (L) 10/13/2020 0218   PROT 6.7 10/13/2020 0218   ALBUMIN 3.5 10/13/2020 0218   AST 26 10/13/2020 0218   ALT 25 10/13/2020 0218   ALKPHOS 69 10/13/2020 0218   BILITOT 0.7 10/13/2020 10/15/2020  GFRNONAA >60 10/13/2020 0218   Toxicology screen in the urine negative B12 level is 204. Normal liver function.  Normal ammonia.   Imaging I have reviewed the images obtained: CT-scan of the brain-no evidence of acute intracranial abnormality MRI examination of the brain-normal for age noncontrast MRI appearance of the brain.  EEG-pending  Felicie Morn PA-C [facility] [phone]  M-F  (9:00 am- 5:00 PM)  10/13/2020, 2:11 PM   Attending Neurohospitalist Addendum Patient seen and examined with APP/Resident. Agree with the history and physical as documented above. Agree with the plan as documented, which I helped formulate. I have independently reviewed the chart, obtained history, review of systems and examined the patient.I have personally reviewed pertinent head/neck/spine imaging (CT/MRI).   Assessment: This is a 64 year old male who had a episode of syncope associated with  vomiting this past Monday.  Patient apparently has had 2-3 further episodes in the past but is a poor historian and cannot explain details about these.  In addition patient has had a significant decline in memory over the past year but over the past 6 months again has become even worse than previous.  It has gotten to the point where he had to move from [place] to [place] for family to watch him closely and family members had to take over his financial management.   Patient does admit in the past to have done crack cocaine and heroin-but when he was much younger. At this point he does not drink or do illicit drugs.   On exam it is clear that he does have neurocognitive decline.  His imaging does not show any significant atrophy to consider early onset dementia but clinically he does appear to be exhibiting symptoms of such.  Brain MRI also is not revealing of changes that are seen with cerebral amyloid angiopathy or with vasculitis which can also present with neurocognitive decline. Also does not have clinical features associated with vasculitis such as headaches. At this point, his lab work reveals extremely low normal vitamin B12 which I think could be contributing to his cognitive decline, and is an easily reversible cause of dementia-like features. At this current point, we are awaiting the EEG results, lest we see an abnormal EEG-concerning for seizures, I am not sure if his episodes of passing out were truly neurological in nature.  He could be experiencing cardiogenic syncope for which she should receive medical work-up as well.  Impression: -Rapid decline in memory-question reversible (B12 deficiency ) versus irreversible (?genetically predisposed-unclear history).  Vascular dementia without history of prior strokes and an MRI that does not look like what would be expected with vascular dementia would put it lower on the differentials.  Imaging and history also not suggestive of CNS  vasculitis -Syncopal episodes  Recommendations: -Obtain EEG-awaiting reading -Obtain TSH, RPR, thiamine levels, A1c (B12 level already checked and low normal)-look for other reversible causes of dementia -Aggressive B12 replenishment -Formal neuropsychological testing as an outpatient with outpatient neurology whenever he is able to. -Medical/cardiology work-up for syncope-2D echocardiogram pending. Consider outpatient tilt table testing and longer term cardiac monitoring (30 day holter) if inpatient work up is unrevealing, -Obtain orthostatic vitals -Obtain CTA head and neck  Please feel free to call with any questions.  We will follow with you  --- Milon Dikes, MD [facility] Pager: [phone] If 7pm to 7am, please call on call as listed on AMION.

## 2020-10-14 ENCOUNTER — Inpatient Hospital Stay (HOSPITAL_COMMUNITY): Payer: Self-pay

## 2020-10-14 DIAGNOSIS — R413 Other amnesia: Secondary | ICD-10-CM

## 2020-10-14 LAB — BASIC METABOLIC PANEL
Anion gap: 7 (ref 5–15)
BUN: 10 mg/dL (ref 8–23)
CO2: 26 mmol/L (ref 22–32)
Calcium: 8.7 mg/dL — ABNORMAL LOW (ref 8.9–10.3)
Chloride: 104 mmol/L (ref 98–111)
Creatinine, Ser: 0.98 mg/dL (ref 0.61–1.24)
GFR, Estimated: >60 mL/min (ref >60)
Glucose, Bld: 105 mg/dL — ABNORMAL HIGH (ref 70–99)
Potassium: 3.6 mmol/L (ref 3.5–5.1)
Sodium: 137 mmol/L (ref 135–145)

## 2020-10-14 LAB — CBC
HCT: 37.2 % — ABNORMAL LOW (ref 39.0–52.0)
Hemoglobin: 12.1 g/dL — ABNORMAL LOW (ref 13.0–17.0)
MCH: 26.4 pg (ref 26.0–34.0)
MCHC: 32.5 g/dL (ref 30.0–36.0)
MCV: 81 fL (ref 80.0–100.0)
Platelets: 179 10*3/uL (ref 150–400)
RBC: 4.59 MIL/uL (ref 4.22–5.81)
RDW: 13.9 % (ref 11.5–15.5)
WBC: 4.8 10*3/uL (ref 4.0–10.5)
nRBC: 0 % (ref 0.0–0.2)

## 2020-10-14 LAB — RPR: RPR Ser Ql: NONREACTIVE

## 2020-10-14 LAB — HEMOGLOBIN A1C
Hgb A1c MFr Bld: 5.8 % — ABNORMAL HIGH (ref 4.8–5.6)
Mean Plasma Glucose: 120 mg/dL

## 2020-10-14 MED ORDER — VITAMIN B-12 1000 MCG PO TABS
1000.0000 ug | ORAL_TABLET | Freq: Every day | ORAL | Status: DC
  Administered 2020-10-14 – 2020-10-15 (×2): 1000 ug via ORAL
  Filled 2020-10-14 (×2): qty 1

## 2020-10-14 MED ORDER — IOHEXOL 350 MG/ML SOLN
75.0000 mL | Freq: Once | INTRAVENOUS | Status: AC | PRN
  Administered 2020-10-14: 75 mL via INTRAVENOUS

## 2020-10-14 NOTE — Consult Note (Addendum)
Neurology follow-up note  Subjective: Patient seen and examined.  No overnight complaints.  No acute events.  Exam: Current vital signs: BP 118/82 (BP Location: Left Arm)   Pulse 73   Temp 98.1 F (36.7 C) (Oral)   Resp 16   Ht 5\' 7"  (1.702 m)   Wt 70.4 kg   SpO2 100%   BMI 24.31 kg/m  Vital signs in last 24 hours: Temp:  [98.1 F (36.7 C)-99.1 F (37.3 C)] 98.1 F (36.7 C) (10/22 0832) Pulse Rate:  [53-87] 73 (10/22 0832) Resp:  [15-20] 16 (10/22 0832) BP: (104-156)/(77-99) 118/82 (10/22 0832) SpO2:  [98 %-100 %] 100 % (10/22 0832) Weight:  [70.4 kg-70.7 kg] 70.4 kg (10/22 0300)   Constitutional: Appears well-developed and well-nourished.  Eyes: No scleral injection HENT: No OP obstrucion Head: Normocephalic.  Cardiovascular: Normal rate and regular rhythm.  Respiratory: Effort normal, non-labored breathing GI: Soft.  No distension. There is no tenderness.  Skin: WDI  Neuro: Mental Status: Patient is awake, alert, oriented to person, place, not month, year or situation-yesterday was not able to tell me what city or state he is in but today was able to tell me he is in 05-21-1970.  Month and year he could not tell me. Speech-she has no dysarthria or aphasia.  Naming, repeating, comprehension is intact.  Able to follow commands without difficulty.  Not able to give a clear and coherent history. Cranial Nerves: II: Visual Fields are full.  III,IV, VI: EOMI without ptosis or diploplia. Pupils equal, round and reactive to light V: Facial sensation is symmetric to temperature VII: Facial movement is symmetric.  VIII: hearing is intact to voice. XII: tongue is midline without atrophy or fasciculations.  Motor: Moving all extremities antigravity with no difficulty. Sensory: Sensation is symmetric to light touch and temperature in the arms and legs. Cerebellar: FNF and HKS are intact bilaterally  Labs I have reviewed labs in epic and the results pertinent to this  consultation are:  CBC    Component Value Date/Time   WBC 4.8 10/14/2020 0223   RBC 4.59 10/14/2020 0223   HGB 12.1 (L) 10/14/2020 0223   HCT 37.2 (L) 10/14/2020 0223   PLT 179 10/14/2020 0223   MCV 81.0 10/14/2020 0223   MCH 26.4 10/14/2020 0223   MCHC 32.5 10/14/2020 0223   RDW 13.9 10/14/2020 0223   LYMPHSABS 1.8 10/10/2020 1951   MONOABS 0.5 10/10/2020 1951   EOSABS 0.1 10/10/2020 1951   BASOSABS 0.0 10/10/2020 1951    CMP     Component Value Date/Time   NA 137 10/14/2020 0223   K 3.6 10/14/2020 0223   CL 104 10/14/2020 0223   CO2 26 10/14/2020 0223   GLUCOSE 105 (H) 10/14/2020 0223   BUN 10 10/14/2020 0223   CREATININE 0.98 10/14/2020 0223   CALCIUM 8.7 (L) 10/14/2020 0223   PROT 6.7 10/13/2020 0218   ALBUMIN 3.5 10/13/2020 0218   AST 26 10/13/2020 0218   ALT 25 10/13/2020 0218   ALKPHOS 69 10/13/2020 0218   BILITOT 0.7 10/13/2020 0218   GFRNONAA >60 10/14/2020 0223   Toxicology screen in the urine negative B12 level is 204-repeat level in the 180s. Normal liver function.  Normal ammonia.  RPR negative TSH normal  Imaging I have reviewed the images obtained: CT-scan of the brain-no evidence of acute intracranial abnormality MRI examination of the brain-normal for age noncontrast MRI appearance of the brain.   EEG-normal  Assessment: This is a 64 year old male  who had a episode of syncope associated with vomiting this past Monday.  Patient apparently has had 2-3 further episodes in the past but is a poor historian and cannot explain details about these.  In addition patient has had a significant decline in memory over the past year but over the past 6 months again has become even worse than previous.  It has gotten to the point where he had to move from [place] to [place] for family to watch him closely and family members had to take over his financial management.   Patient does admit in the past to have done crack cocaine and heroin-but when he  was much younger. At this point he does not drink or do illicit drugs.   On exam it is clear that he does have neurocognitive decline.  His imaging does not show any significant atrophy to consider early onset dementia but clinically he does appear to be exhibiting symptoms of such.  Brain MRI also is not revealing of changes that are seen with cerebral amyloid angiopathy or with vasculitis which can also present with neurocognitive decline. Also does not have clinical features associated with vasculitis such as headaches. At this point, his lab work reveals extremely low normal vitamin B12 which I think could be contributing to his cognitive decline, and is an easily reversible cause of dementia-like features. At this current point, we are awaiting the EEG results, lest we see an abnormal EEG-concerning for seizures, I am not sure if his episodes of passing out were truly neurological in nature.  He could be experiencing cardiogenic syncope for which she should receive medical work-up as well.  Impression: -Rapid decline in memory-question reversible (B12 deficiency ) versus irreversible (?genetically predisposed-unclear history).  Vascular dementia without history of prior strokes and an MRI that does not look like what would be expected with vascular dementia would put it lower on the differentials.  Imaging and history also not suggestive of CNS vasculitis -Syncopal episodes  Recommendations: -Aggressive B12 replenishment -Formal neuropsychological testing as an outpatient with outpatient neurology whenever he is able to. -Medical/cardiology work-up for syncope-2D echocardiogram pending. Consider outpatient tilt table testing and longer term cardiac monitoring (30 day holter) if inpatient work up is unrevealing, -Obtain orthostatic vitals -Obtain CTA head and neck-pending-I will follow with you. No further inpatient neurological work-up recommended.  Please feel free to call with any  questions.  I will follow the CTA head and neck with you.  --- Milon Dikes, MD [facility] Pager: [phone] If 7pm to 7am, please call on call as listed on AMION.   Addendum CTA head and neck essentially unremarkable. No significant stenosis to suggest vertebrobasilar etiology or other etiology of syncope. No evidence of vasculitic changes although not the best modality to assess for it.   No new recs.  Recommendations as above Please call us as needed.  -- Milon Dikes, MD [facility] Pager: [phone] If 7pm to 7am, please call on call as listed on AMION.

## 2020-10-14 NOTE — Progress Notes (Signed)
Family Medicine Teaching Service Daily Progress Note Intern Pager: [phone]  Patient name: Chad Daniel Medical record number: [id number] Date of birth: 12-05-1956 Age: 64 y.o. Gender: male  Primary Care Provider: Patient, No Pcp Per Consultants: Neurology  Code Status: Full  Pt Overview and Major Events to Date:  Admitted 10/20  Assessment and Plan: Chad Daniel is a 64 y.o. male presenting with syncope. PMH is significant for hypertension.  Syncopal episode  Orthostatic hypotension Patient presents with history of multiple syncopal episodes over the last year or 2, most recently about 2 days ago involving a witnessed episode of slumping in the chair while getting a haircut lasting about 10 minutes. CXR showed no active cardiopulmonary disease. CT unremarlable. MRI normal.  EKG NSR. Tropnonin's negative. Patient not hypoglycemic or uremic.  Patient with remote history of substance abuse however UDS was negative. EEG normal. Echo demonstrated EF 60-65% with normal LV function consistent with Grade 1 diastolic dysfunction. Orthostatics 104/79 sitting and 88/72 standing, likely contributing to patient's multiple syncopal episodes. -neurology following, appreciate involvement and recs -if inpatient workup inconclusive, consider outpatient tilt table testing and long-term cardiac holter monitoring, per neuro.  -consider CTA head and neck. -daily labs -encourage hydration -consider neurology outpatient     Memory Loss Per ex-wife, patient with rapid decline in the patient's memory since June/July.  Patient admits he has been forgetful and often forgets things like where to put stuff. A&Ox3. CT head was negative for acute intracranial process.  Given patient's reported diet, consider nutritional deficiencies. Concern for vascular dementia given these multiple episodes and rapid confusion that has worsened in the past 3-4 months. MRI normal. Folate wnl.  - Neurology consulted, appreciate  continued involvement  -SLP evaluation placed, appreciate recommendations  - Vit B12 204. MMA pending -continue Multivitamin -consider neurology outpatient   Normocytic anemia Stable from yesterday. Hgb 12.1 this morning with normal MCV. Likely uncontrolled hypertension a contributing factor.  -monitor daily CBC  HTN Normotensive this morning at 128/84. Patient has not seen a physician in over 12 years. Currently not on any home medications. BP on admission is 160/105. Used to be on BP meds but stopped taking them over a year ago.  -Continue amlodipine 5 mg daily   Tobacco use  Reports smoking 1 pack every couple of days.   -nicotine patch 14 mg  History of substance abuse Patient reports abstinence for greater than 10 years.  Admits to IV drug use in the past.  UDS negative.  - Hep C reactive. Pending NAAT -education and encouragement on cessation    FEN/GI: heart healthy diet PPx: Lovenox   Status is: Inpatient   Disposition: inpatient      Subjective:  Patient denies chest pain, dyspnea and generalized or localized pain. Concerned about his memory in general but no new concerns at this time.  Objective: Temp:  [98.3 F (36.8 C)-99.1 F (37.3 C)] 98.5 F (36.9 C) (10/22 0353) Pulse Rate:  [53-87] 53 (10/22 0353) Resp:  [15-20] 18 (10/22 0353) BP: (104-156)/(77-99) 128/84 (10/22 0353) SpO2:  [98 %-100 %] 100 % (10/22 0353) Weight:  [70.4 kg-70.7 kg] 70.4 kg (10/22 0300) Physical Exam: General: Patient laying comfortably in bed, in no acute distress. Cardiovascular: RRR, no murmurs or gallops auscultated  Respiratory: lungs clear to auscultation bilaterally  Abdomen: soft, nontender, presence of active bowel sounds Extremities: radial and distal pulses intact bilaterally, no LE edema noted bilaterally  Neuro: AOx2, CN2-12 grossly intact, gross sensation intact, 5/5 strength UE and  LE bilaterally, no evidence of pronator drift, normal finger to nose  testing Psych: mood appropriate   Laboratory: Recent Labs  Lab 10/12/20 1308 10/13/20 0218 10/14/20 0223  WBC 5.6 5.7 4.8  HGB 13.4 12.2* 12.1*  HCT 42.7 38.6* 37.2*  PLT 216 181 179   Recent Labs  Lab 10/10/20 1951 10/10/20 1951 10/12/20 1308 10/12/20 1619 10/13/20 0218 10/14/20 0223  NA 141   < > 139  --  138 137  K 3.8   < > 3.6  --  3.9 3.6  CL 105   < > 103  --  105 104  CO2 25   < > 25  --  26 26  BUN 21   < > 9  --  10 10  CREATININE 1.14   < > 1.11  --  1.02 0.98  CALCIUM 9.1   < > 9.2  --  8.7* 8.7*  PROT 7.5  --   --  7.3 6.7  --   BILITOT 0.4  --   --  0.7 0.7  --   ALKPHOS 75  --   --  67 69  --   ALT 31  --   --  30 25  --   AST 33  --   --  34 26  --   GLUCOSE 114*   < > 109*  --  99 105*   < > = values in this interval not displayed.      Imaging/Diagnostic Tests: EEG adult  Result Date: 10/13/2020 Charlsie Quest, MD     10/13/2020  2:22 PM Patient Name: Chad Daniel MRN: [id number] Epilepsy Attending: Charlsie Quest Referring Physician/Provider:  Dr Katha Cabal Date: 1021/2021 Duration: 25.39 mins Patient history: 64 year old male with syncope.  EEG to evaluate for seizures. Level of alertness: Awake AEDs during EEG study: None Technical aspects: This EEG study was done with scalp electrodes positioned according to the 10-20 International system of electrode placement. Electrical activity was acquired at a sampling rate of 500Hz  and reviewed with a high frequency filter of 70Hz  and a low frequency filter of 1Hz . EEG data were recorded continuously and digitally stored. Description: The posterior dominant rhythm consists of 8 Hz activity of moderate voltage (25-35 uV) seen predominantly in posterior head regions, symmetric and reactive to eye opening and eye closing. Hyperventilation and photic stimulation were not performed.   IMPRESSION: This study is within normal limits. No seizures or epileptiform discharges were seen throughout the recording.    ECHOCARDIOGRAM COMPLETE  Result Date: 10/13/2020    ECHOCARDIOGRAM REPORT   Patient Name:   Chad Daniel Date of Exam: 10/13/2020 Medical Rec #:  10/15/2020     Height:       67.0 in Accession #:    Chad Mages    Weight:       155.0 lb Date of Birth:  Sep 05, 1956    BSA:          1.815 m Patient Age:    63 years      BP:           129/93 mmHg Patient Gender: M             HR:           51 bpm. Exam Location:  Inpatient Procedure: 2D Echo, Cardiac Doppler and Color Doppler Indications:    Syncope  History:        Patient has no prior history of  Echocardiogram examinations.                 Signs/Symptoms:Syncope; Risk Factors:Current Smoker and                 Hypertension. Substance use.  Sonographer:    Lavenia Atlas Referring Phys: [id number] ADAM CURATOLO IMPRESSIONS  1. Left ventricular ejection fraction, by estimation, is 60 to 65%. The left ventricle has normal function. The left ventricle has no regional wall motion abnormalities. Left ventricular diastolic parameters are consistent with Grade I diastolic dysfunction (impaired relaxation).  2. Right ventricular systolic function is normal. The right ventricular size is normal. There is normal pulmonary artery systolic pressure. The estimated right ventricular systolic pressure is 27.6 mmHg.  3. The mitral valve is normal in structure. Mild mitral valve regurgitation. No evidence of mitral stenosis.  4. The aortic valve is normal in structure. Aortic valve regurgitation is trivial. No aortic stenosis is present.  5. The inferior vena cava is normal in size with greater than 50% respiratory variability, suggesting right atrial pressure of 3 mmHg. Comparison(s): No prior Echocardiogram. FINDINGS  Left Ventricle: Left ventricular ejection fraction, by estimation, is 60 to 65%. The left ventricle has normal function. The left ventricle has no regional wall motion abnormalities. The left ventricular internal cavity size was normal in size.  There is  no left ventricular hypertrophy. Left ventricular diastolic parameters are consistent with Grade I diastolic dysfunction (impaired relaxation). Normal left ventricular filling pressure. Right Ventricle: The right ventricular size is normal. No increase in right ventricular wall thickness. Right ventricular systolic function is normal. There is normal pulmonary artery systolic pressure. The tricuspid regurgitant velocity is 2.48 m/s, and  with an assumed right atrial pressure of 3 mmHg, the estimated right ventricular systolic pressure is 27.6 mmHg. Left Atrium: Left atrial size was normal in size. Right Atrium: Right atrial size was normal in size. Pericardium: There is no evidence of pericardial effusion. Mitral Valve: The mitral valve is normal in structure. Mild mitral valve regurgitation. No evidence of mitral valve stenosis. Tricuspid Valve: The tricuspid valve is normal in structure. Tricuspid valve regurgitation is mild . No evidence of tricuspid stenosis. Aortic Valve: The aortic valve is normal in structure. Aortic valve regurgitation is trivial. No aortic stenosis is present. Pulmonic Valve: The pulmonic valve was normal in structure. Pulmonic valve regurgitation is trivial. No evidence of pulmonic stenosis. Aorta: The aortic root is normal in size and structure. Venous: The inferior vena cava is normal in size with greater than 50% respiratory variability, suggesting right atrial pressure of 3 mmHg. IAS/Shunts: No atrial level shunt detected by color flow Doppler.  LEFT VENTRICLE PLAX 2D LVIDd:         4.80 cm  Diastology LVIDs:         3.70 cm  LV e' medial:    7.72 cm/s LV PW:         1.10 cm  LV E/e' medial:  6.9 LV IVS:        0.90 cm  LV e' lateral:   9.90 cm/s LVOT diam:     2.30 cm  LV E/e' lateral: 5.4 LV SV:         80 LV SV Index:   44 LVOT Area:     4.15 cm  RIGHT VENTRICLE RV Basal diam:  3.60 cm RV S prime:     12.70 cm/s TAPSE (M-mode): 3.0 cm LEFT ATRIUM  Index        RIGHT ATRIUM           Index LA diam:        3.10 cm 1.71 cm/m  RA Area:     18.20 cm LA Vol (A2C):   27.5 ml 15.15 ml/m RA Volume:   54.10 ml  29.81 ml/m LA Vol (A4C):   29.5 ml 16.26 ml/m LA Biplane Vol: 29.1 ml 16.04 ml/m  AORTIC VALVE LVOT Vmax:   102.00 cm/s LVOT Vmean:  70.000 cm/s LVOT VTI:    0.193 m  AORTA Ao Root diam: 3.30 cm MITRAL VALVE               TRICUSPID VALVE MV Area (PHT): 1.98 cm    TR Peak grad:   24.6 mmHg MV Decel Time: 384 msec    TR Vmax:        248.00 cm/s MV E velocity: 53.60 cm/s MV A velocity: 81.40 cm/s  SHUNTS MV E/A ratio:  0.66        Systemic VTI:  0.19 m                            Systemic Diam: 2.30 cm Tobias Alexander MD Electronically signed by Tobias Alexander MD Signature Date/Time: 10/13/2020/11:54:06 PM    Final     Reece Leader, DO 10/14/2020, 6:06 AM PGY-1, [facility] FPTS Intern pager: [phone], text pages welcome

## 2020-10-14 NOTE — Progress Notes (Signed)
  Mobility Specialist Criteria Algorithm Info.  SATURATION QUALIFICATIONS: (This note is used to comply with regulatory documentation for home oxygen)  Patient Saturations on Room Air at Rest = 100%  Patient Saturations on Room Air while Ambulating = 100%  Patient Saturations on 0 Liters of oxygen while Ambulating = n/a%  Please briefly explain why patient needs home oxygen:  Mobility Team:  [facility] elevated:Self regulated Activity: Ambulated in hall Range of motion: Active;All extremities Level of assistance:  Assistive device: None Minutes sitting in chair:  Minutes stood: 5 minutes Minutes ambulated: 5 minutes Distance ambulated (ft): 500 ft Mobility response: Tolerated well Bed Position: Chair ([place] chair)  Pt eager and willing to participate in mobility this morning, reporting PTA he was very active. Pt also explained that he would have random spells of dizziness, only while ambulating, usually subsiding after a few seconds. Patient was not orthostatic when checked (Flowsheet 1133 AM) and agreed to ambulate in hallway. He ambulated with steady gait, independently, with no complaints of dizziness or lightheadedness. Pt is now sitting in recliner chair with all needs met.   10/14/2020 3:17 PM

## 2020-10-14 NOTE — Plan of Care (Signed)
  Problem: Education: Goal: Knowledge of condition and prescribed therapy will improve Outcome: Progressing   Problem: Education: Goal: Knowledge of condition and prescribed therapy will improve Outcome: Progressing   Problem: Safety: Goal: Ability to remain free from injury will improve Outcome: Progressing   Problem: Safety: Goal: Ability to remain free from injury will improve Outcome: Progressing

## 2020-10-15 LAB — METHYLMALONIC ACID, SERUM: Methylmalonic Acid, Quantitative: 331 nmol/L (ref 0–378)

## 2020-10-15 LAB — CBC
HCT: 38.9 % — ABNORMAL LOW (ref 39.0–52.0)
Hemoglobin: 12.6 g/dL — ABNORMAL LOW (ref 13.0–17.0)
MCH: 26.3 pg (ref 26.0–34.0)
MCHC: 32.4 g/dL (ref 30.0–36.0)
MCV: 81.2 fL (ref 80.0–100.0)
Platelets: 180 10*3/uL (ref 150–400)
RBC: 4.79 MIL/uL (ref 4.22–5.81)
RDW: 13.7 % (ref 11.5–15.5)
WBC: 5.3 10*3/uL (ref 4.0–10.5)
nRBC: 0 % (ref 0.0–0.2)

## 2020-10-15 LAB — BASIC METABOLIC PANEL
Anion gap: 8 (ref 5–15)
BUN: 11 mg/dL (ref 8–23)
CO2: 25 mmol/L (ref 22–32)
Calcium: 8.9 mg/dL (ref 8.9–10.3)
Chloride: 105 mmol/L (ref 98–111)
Creatinine, Ser: 0.94 mg/dL (ref 0.61–1.24)
GFR, Estimated: >60 mL/min (ref >60)
Glucose, Bld: 107 mg/dL — ABNORMAL HIGH (ref 70–99)
Potassium: 3.5 mmol/L (ref 3.5–5.1)
Sodium: 138 mmol/L (ref 135–145)

## 2020-10-15 LAB — HCV RNA QUANT
HCV Quantitative Log: 6.519 log10 IU/mL (ref >1.70)
HCV Quantitative: 3300000 IU/mL (ref >50)

## 2020-10-15 LAB — HEPATITIS C GENOTYPE

## 2020-10-15 MED ORDER — B-12 5000 MCG PO CAPS: ORAL_CAPSULE | ORAL | 0 refills | Status: AC

## 2020-10-15 NOTE — Progress Notes (Addendum)
Family Medicine Teaching Service Daily Progress Note Intern Pager: [phone]  Patient name: Chad Daniel Medical record number: [id number] Date of birth: [birth year] Age: 64 y.o. Gender: male  Primary Care Provider: Patient, No Pcp Per Consultants: neurology Code Status: full  Pt Overview and Major Events to Date:  Admitted 10/20  Assessment and Plan: Broc Caspers is a 64 y.o. male presenting with syncope and progressive memory loss. PMH is significant for hypertension.  Syncope likely 2/2 orthostatic hypotension Multiple syncopal episodes over the past year. Last one 2 days prior to admission.  Head imaging, cardiac testing unremarkable.  Pt had drop in SBP > w/ orthostatic vitals. Most recent orthostatics negative.  Also consider arrhythmia in the differential.  - neurology consulted and signed off.  Appreciate their recommendations.  - o/p long term cardiac monitoring. Cardiology to see pt in clinic and likely place holter monitor (lack of insurance precludes implantable loop recorder) - encourage hydration  Memory loss Reported rapid decline in memory since June.  Head imaging negative except fro diffuse atrophy.  Low B12 at 204. Neurology consulted and recommending B12 repletion and neuropsychiatry testing outpatient.  - B12 supplementation - o/p neuropsychiatric testing.  - f/u inpatient SLP cognitive testing  Normocytic anemia Stable.  Minimally low at 12.6.  - continue to monitor  HTN BP on admission 160/105. Not on medication at home. Normotensive today.  - continue to monitor  HLD LDL 121 during hospitalization.  ASCVD risk is 13.7%, meeting recommendation for statin therapy. Liver function labs normal this admission.  - recommend starting statin at hospital f/u.    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   - f/u NAAT  No PCP Needs PCP.   Currently no insurance, will turn  37 in December.  - f/u w/ family medicine - referral to clinic social worker for pcp services at hospital f/u.   FEN/GI:  PPx: lovenox  Disposition: home  Subjective:  Pt feels well today and would like to know if he can go home today.  No syncopal episodes or dizzy spells since admission.  Wants January to speak with his wife later today.    Objective: Temp:  [98 F (36.7 C)-99.1 F (37.3 C)] 98 F (36.7 C) (10/23 0436) Pulse Rate:  [62-89] 62 (10/23 0436) Resp:  [16-20] 18 (10/23 0436) BP: (118-140)/(82-94) 140/88 (10/23 0436) SpO2:  [100 %] 100 % (10/23 0436) Weight:  [70.4 kg] 70.4 kg (10/23 0100) Physical Exam: General: alert.  Oriented.  No acute distress.  Cardiovascular: RRR. No murmurs.  Respiratory: LCTAB. No wheezes.   Abdomen: soft nontender.     Laboratory: Recent Labs  Lab 10/13/20 0218 10/14/20 0223 10/15/20 0354  WBC 5.7 4.8 5.3  HGB 12.2* 12.1* 12.6*  HCT 38.6* 37.2* 38.9*  PLT 181 179 180   Recent Labs  Lab 10/10/20 1951 10/12/20 1308 10/12/20 1619 10/13/20 0218 10/14/20 0223 10/15/20 0354  NA 141   < >  --  138 137 138  K 3.8   < >  --  3.9 3.6 3.5  CL 105   < >  --  105 104 105  CO2 25   < >  --  26 26 25   BUN 21   < >  --  10 10 11   CREATININE 1.14   < >  --  1.02 0.98 0.94  CALCIUM 9.1   < >  --  8.7* 8.7* 8.9  PROT 7.5  --  7.3 6.7  --   --   BILITOT 0.4  --  0.7 0.7  --   --   ALKPHOS 75  --  67 69  --   --   ALT 31  --  30 25  --   --   AST 33  --  34 26  --   --   GLUCOSE 114*   < >  --  99 105* 107*   < > = values in this interval not displayed.      Imaging/Diagnostic Tests:   Sandre Kitty, MD 10/15/2020, 8:01 AM PGY-3, [facility] FPTS Intern pager: [phone], text pages welcome

## 2020-10-15 NOTE — Discharge Summary (Signed)
Family Medicine Teaching [facility] Discharge Summary  Patient name: Chad Daniel Medical record number: [id number] Date of birth: Mar 23, 1956 Age: 64 y.o. Gender: male Date of Admission: 10/12/2020  Date of Discharge: 10/15/20 Admitting Physician: Leighton Roach McDiarmid, MD  Primary Care Provider: Patient, No Pcp Per Consultants: neurology  Indication for Hospitalization: syncope,   Discharge Diagnoses/Problem List:  Syncope Memory loss Normocytic anemia HTN HLD B12   Disposition: home  Discharge Condition: improved, stable  Discharge Exam:  BP 94/82 (BP Location: Left Arm)   Pulse 76   Temp 98.3 F (36.8 C) (Oral)   Resp 15   Ht 5\' 7"  (1.702 m)   Wt 70.4 kg Comment: scale a  SpO2 100%   BMI 24.32 kg/m  General: alert.  Oriented.  No acute distress.  Cardiovascular: RRR. No murmurs.  Respiratory: LCTAB. No wheezes.   Abdomen: soft nontender.    Brief Hospital Course:   Syncope Pt presented after having a syncopal episode that lasted several minutes while sitting down two days prior to admission.  He had had multiple episodes over the past two years.  Neurology was consulted.  MRI, CTA head and neck, and EEG were are normal. Neurology consulted and in the absence of other indentifibable causes, suggested B12 may be causing the syncope/memory loss. Orthostatic vitals were normal on day of discharge.  Cardiologist was consulted informally for suggestions on holter monitoring and have agreed to see pt outpatient for holter monitor placement.   Memory loss Patient and family have endorsed rapid decline in cognitive function over the past year.  RPR and HIV negative.  Head imaging and EEG normal.  B12 was low at 204.  Pt was given B12 supplementation in the hospital and advised to continue outpatient.    Positive Hep C ab The patient endorsed a remote history of IV drug use.  Hep C Ab was positive.  Hep C RNA quant was ordered but not resulted at the time of discharge.     Lack of PCP Pt has no pcp and was not taking medicatoins prior to this hospitalization.  Will need a pcp for further workup of his memory concerns, syncope, HLD, and possible HCV. Informed pt he can be a patient of [facility] practice if he chooses.  Will help him find pcp if he declines.    Issues for Follow Up:  1. Syncope - pt had no syncopal episodes while hospitalized.  Make sure pt has seen electrophysiology for holter monitor placement 2. Memory loss - will need neuropsychiatric testing w/ PM&R referral.   3. B12 - make sure pt still taking B12 supplementation.   4. Hep C - pt hep C Ab positive.  RNA quant pending.  Will need referral to GI if pt has active disease 5. PCP - PT has no pcp.  Help establish pcp on f/u visit.  6. HLD - pt has ASCVD risk of 13.7.  Would benefit from a statin.    Significant Procedures: none  Significant Labs and Imaging:  Recent Labs  Lab 10/13/20 0218 10/14/20 0223 10/15/20 0354  WBC 5.7 4.8 5.3  HGB 12.2* 12.1* 12.6*  HCT 38.6* 37.2* 38.9*  PLT 181 179 180   Recent Labs  Lab 10/10/20 1951 10/10/20 1951 10/12/20 1308 10/12/20 1308 10/12/20 1619 10/13/20 0218 10/13/20 0218 10/14/20 0223 10/15/20 0354  NA 141  --  139  --   --  138  --  137 138  K 3.8   < > 3.6   < >  --  3.9   < > 3.6 3.5  CL 105  --  103  --   --  105  --  104 105  CO2 25  --  25  --   --  26  --  26 25  GLUCOSE 114*  --  109*  --   --  99  --  105* 107*  BUN 21  --  9  --   --  10  --  10 11  CREATININE 1.14  --  1.11  --   --  1.02  --  0.98 0.94  CALCIUM 9.1  --  9.2  --   --  8.7*  --  8.7* 8.9  ALKPHOS 75  --   --   --  67 69  --   --   --   AST 33  --   --   --  34 26  --   --   --   ALT 31  --   --   --  30 25  --   --   --   ALBUMIN 4.1  --   --   --  3.9 3.5  --   --   --    < > = values in this interval not displayed.    MRI Brain: Normal for age noncontrast MRI appearance of the brain.  EEG: normal  Echo: LVEF 60-65%; no wall motion abnormalities;  G1DD;   Results/Tests Pending at Time of Discharge: HCV RNA  Discharge Medications:  Allergies as of 10/15/2020   No Known Allergies     Medication List    TAKE these medications   B-12 5000 MCG Caps Please take 1 capsule daily.       Discharge Instructions: Please refer to Patient Instructions section of EMR for full details.  Patient was counseled important signs and symptoms that should prompt return to medical care, changes in medications, dietary instructions, activity restrictions, and follow up appointments.   Follow-Up Appointments:  Follow-up Information    [facility]. Go on 10/24/2020.   Contact information: [address] [place] [address] [phone]              Sandre Kitty, MD 10/16/2020, 4:43 PM PGY-3, [place] [place] [facility]

## 2020-10-15 NOTE — Discharge Instructions (Signed)
We evaluated you for causes of your fainting episodes.  We would like to continue further workup outside the hospital.  This involves having the cardiologist place a 24 hour monitor on you to check your heart for at least a few days.  The cardiologist office will call you next week to set this up.  I have scheduled you an appointment with Dr. Constance Goltz in the family medicine clinic for follow up of your hospital visit.  This is on November 1st.    We did not make any changes to your medications other than adding a vitamin b12 supplement for you to take every day.  You can pick these up over the counter at any pharmacy.  Please make sure you take one with 5000 micrograms as the dose.  Your B12 was low and this could be contributing to your memory issues.   If you have another episode of fainting please seek medical attention

## 2020-10-15 NOTE — Progress Notes (Signed)
SATURATION QUALIFICATIONS: (This note is used to comply with regulatory documentation for home oxygen)  Patient Saturations on Room Air at Rest = 100%  Patient Saturations on Room Air while Ambulating = 96%  Patient Saturations on 0 Liters of oxygen while Ambulating = 95%  Please briefly explain why patient needs home oxygen:  Patient is alert and oriented x 4 and expresses no shortness of breathe at rest while on room air. Patient walked from patient room to the nurses station back to patient with oxygen saturations trending 94-96%. Patient continues to express no shortness of breathe or dizziness. Patient does not qualify for home oxygen. Patient is sitting on the recliner, chair alarm is on, call bell and telephone is within reach. Primary RN notified.

## 2020-10-15 NOTE — Evaluation (Signed)
Speech Language Pathology Evaluation Patient Details Name: Chad Daniel MRN: [id number] DOB: Nov 09, 1956 Today's Date: 10/15/2020 Time: 4481-8563 SLP Time Calculation (min) (ACUTE ONLY): 25 min  Problem List:  Patient Active Problem List   Diagnosis Date Noted  . Memory impairment 10/13/2020  . Essential hypertension 10/13/2020  . HCV antibody positive 10/13/2020  . Syncope 10/12/2020   Past Medical History:  Past Medical History:  Diagnosis Date  . Essential hypertension 10/13/2020  . IV drug abuse, History of    Past Surgical History: History reviewed. No pertinent surgical history. HPI:  Chad Daniel is a 64 y.o. male presenting with syncope. PMH is significant for hypertension and significantly worsening cognition over the past 8-12 months. All imaging thus far is negative.   Assessment / Plan / Recommendation Clinical Impression  Chad Daniel demonstrates a severe cognitive impairment with reports of cognition continuing to worsen over past several months. He is no longer handling his own finances, medications, driving, etc due to this and states a year ago he was "totally fine." Pt is a retired Lawyer with high school education. Head MRI shows WNL for age without evidence of abnormality. He told SLP he thinks he's "getting Alzheimer's" but denies family history of the disease. His deficits were in orientation, attention, memory, problem solving, and executive functions. No concerns noted in speech/language/communication/swallowing. He is not presently safe for IADL tasks given cognitive impairment. Continue recommendation for ex-wife and daughter to handle driving, meds, finances, etc. Pt demonstrates good judgment, stating he knows he cannot do these tasks safely and certainly should not be driving anymore. He has good insight into his deficits, but oddly did not seem too disturbed by them.   He is recommended for follow up as an outpatient with neuropsychology prior to outpatient  speech therapy for cognitive strategies and family education (pending etiology of deficits). Pt demonstrates understanding and agreement.    SLP Assessment  SLP Recommendation/Assessment: All further Speech Lanaguage Pathology  needs can be addressed in the next venue of care SLP Visit Diagnosis: Cognitive communication deficit (R41.841);Frontal lobe and executive function deficit Frontal lobe and executive function deficit following: Other cerebrovascular disease    Follow Up Recommendations  Outpatient SLP       SLP Evaluation Cognition  Overall Cognitive Status: Impaired/Different from baseline Arousal/Alertness: Awake/alert Orientation Level: Oriented to person;Oriented to place;Disoriented to time;Oriented to situation Focused Attention: Impaired Memory: Impaired Memory Impairment: Storage deficit;Retrieval deficit;Decreased recall of new information;Decreased short term memory Decreased Short Term Memory: Verbal complex;Functional basic;Functional complex;Verbal basic Awareness: Appears intact Problem Solving: Impaired Problem Solving Impairment: Verbal basic;Verbal complex;Functional basic;Functional complex Safety/Judgment: Appears intact       Comprehension  Auditory Comprehension Overall Auditory Comprehension: Appears within functional limits for tasks assessed Reading Comprehension Reading Status: Within funtional limits    Expression Expression Primary Mode of Expression: Verbal Verbal Expression Overall Verbal Expression: Appears within functional limits for tasks assessed Initiation: No impairment Naming: No impairment Pragmatics: No impairment Written Expression Dominant Hand: Right Written Expression: Within Functional Limits   Oral / Motor  Oral Motor/Sensory Function Overall Oral Motor/Sensory Function: Within functional limits Motor Speech Overall Motor Speech: Appears within functional limits for tasks assessed Respiration: Within functional  limits Phonation: Normal Resonance: Within functional limits Articulation: Within functional limitis Intelligibility: Intelligible Motor Planning: Witnin functional limits Interfering Components: Premorbid status                      Chad Daniel P. Chad Daniel, M.S., CCC-SLP Speech-Language Pathologist Acute  Rehabilitation Services Pager: [phone]  Chad Daniel Chad Daniel 10/15/2020, 1:26 PM

## 2020-10-15 NOTE — Plan of Care (Signed)
  Problem: Education: Goal: Knowledge of condition and prescribed therapy will improve Outcome: Adequate for Discharge   Problem: Cardiac: Goal: Will achieve and/or maintain adequate cardiac output Outcome: Adequate for Discharge   Problem: Physical Regulation: Goal: Complications related to the disease process, condition or treatment will be avoided or minimized Outcome: Adequate for Discharge   Problem: Education: Goal: Expressions of having a comfortable level of knowledge regarding the disease process will increase Outcome: Adequate for Discharge   Problem: Coping: Goal: Ability to adjust to condition or change in health will improve Outcome: Adequate for Discharge Goal: Ability to identify appropriate support needs will improve Outcome: Adequate for Discharge   Problem: Health Behavior/Discharge Planning: Goal: Compliance with prescribed medication regimen will improve Outcome: Adequate for Discharge   Problem: Medication: Goal: Risk for medication side effects will decrease Outcome: Adequate for Discharge   Problem: Clinical Measurements: Goal: Complications related to the disease process, condition or treatment will be avoided or minimized Outcome: Adequate for Discharge Goal: Diagnostic test results will improve Outcome: Adequate for Discharge   Problem: Safety: Goal: Verbalization of understanding the information provided will improve Outcome: Adequate for Discharge   Problem: Self-Concept: Goal: Level of anxiety will decrease Outcome: Adequate for Discharge Goal: Ability to verbalize feelings about condition will improve Outcome: Adequate for Discharge   Problem: Education: Goal: Knowledge of General Education information will improve Description: Including pain rating scale, medication(s)/side effects and non-pharmacologic comfort measures Outcome: Adequate for Discharge   Problem: Health Behavior/Discharge Planning: Goal: Ability to manage  health-related needs will improve Outcome: Adequate for Discharge   Problem: Clinical Measurements: Goal: Ability to maintain clinical measurements within normal limits will improve Outcome: Adequate for Discharge Goal: Will remain free from infection Outcome: Adequate for Discharge Goal: Diagnostic test results will improve Outcome: Adequate for Discharge Goal: Respiratory complications will improve Outcome: Adequate for Discharge Goal: Cardiovascular complication will be avoided Outcome: Adequate for Discharge   Problem: Activity: Goal: Risk for activity intolerance will decrease Outcome: Adequate for Discharge   Problem: Nutrition: Goal: Adequate nutrition will be maintained Outcome: Adequate for Discharge   Problem: Coping: Goal: Level of anxiety will decrease Outcome: Adequate for Discharge   Problem: Elimination: Goal: Will not experience complications related to bowel motility Outcome: Adequate for Discharge Goal: Will not experience complications related to urinary retention Outcome: Adequate for Discharge   Problem: Pain Managment: Goal: General experience of comfort will improve Outcome: Adequate for Discharge   Problem: Safety: Goal: Ability to remain free from injury will improve Outcome: Adequate for Discharge   Problem: Skin Integrity: Goal: Risk for impaired skin integrity will decrease Outcome: Adequate for Discharge

## 2020-10-16 LAB — VITAMIN B1: Vitamin B1 (Thiamine): 85.9 nmol/L (ref 66.5–200.0)

## 2020-10-24 ENCOUNTER — Ambulatory Visit: Payer: Self-pay | Admitting: Family Medicine

## 2020-10-24 NOTE — Progress Notes (Deleted)
    SUBJECTIVE:   CHIEF COMPLAINT / HPI:   Cardiology: has he talked to the cardiologist?   Hep C: needs referral to GI.   B12: is he taking   Memory loss: PM&R neuropsych testing.    Statin: ascvd risk 13.7  PERTINENT  PMH / PSH: ***  OBJECTIVE:   There were no vitals taken for this visit.  ***  ASSESSMENT/PLAN:   No problem-specific Assessment & Plan notes found for this encounter.     Sandre Kitty, MD [facility]

## 2020-10-28 ENCOUNTER — Telehealth: Payer: Self-pay

## 2020-10-28 ENCOUNTER — Other Ambulatory Visit: Payer: Self-pay | Admitting: Family Medicine

## 2020-10-28 DIAGNOSIS — R768 Other specified abnormal immunological findings in serum: Secondary | ICD-10-CM

## 2020-10-28 NOTE — Progress Notes (Signed)
Pt missed our hospital follow up last week d/t miscommunication with our front office staff.    Pt not yet established with [facility], but he needs a referral to GI for treatment of active Hep C infection.    Communication has been through his wife, as pt has memory issues that interfere with his ability to make his own health care decisions.

## 2020-10-28 NOTE — Telephone Encounter (Signed)
Called pt's wife back and discussed results with her.

## 2020-10-28 NOTE — Telephone Encounter (Signed)
Patient's wife, [name], returns missed phone call to nurse line. Unable to find documentation from who was trying to reach her. Patient's wife reports that Dr. Constance Goltz was calling to follow up from hospital.   Please return call to [phone]  Veronda Prude, RN

## 2020-11-23 NOTE — Progress Notes (Signed)
    SUBJECTIVE:   CHIEF COMPLAINT / HPI:   Hep C: Patient has an appoint with the infectious disease specialist next week.  Syncope: Patient has no longer had any more syncopal episodes since discharge.  He has an appointment with the electrophysiologist later this week and will get a Holter monitor placed at that time  Memory: Patient still having issues with memory.  Wife, who separated from him many years ago, is now helping take care of the patient since she moved back to help her mother.  Patient admits to having issues with memory.  Smoking: Patient states he still smokes daily.  Does not currently have a desire to quit.  Patient understands that smoking cessation will help with prevention of not only lung disease but also heart disease and stroke.  PERTINENT  PMH / PSH: Hep C, syncope  OBJECTIVE:   BP 130/88   Pulse 61   Ht 5\' 7"  (1.702 m)   Wt 163 lb 12.8 oz (74.3 kg)   SpO2 99%   BMI 25.65 kg/m   General: Alert and oriented.  No acute distress.  Wife is with him CV: Regular rate and rhythm, no murmurs Pulmonary:: Lungs clear to auscultation bilaterally, no wheezes. Psych: Patient able to hold a normal conversation.  Is oriented.  Pleasant affect.  ASSESSMENT/PLAN:   HCV antibody positive Patient will follow up with infectious disease specialist where he will receive treatment for his hepatitis C.  Syncope No syncopal episodes since discharge.  Will get long-term monitor placed when he speaks with a cardiologist.  Memory impairment Unsure what is causing the patient's cognitive impairment.  MRI of brain in the hospital was unrevealing.  Lab work thus far is also unrevealing.  Discussed with patient and wife the different kinds of dementia and how they present.  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  Will have patient formally evaluated with neuropsychiatric testing by PM&R specialist.     [phone], MD [facility]  [facility]

## 2020-11-24 ENCOUNTER — Other Ambulatory Visit: Payer: Self-pay

## 2020-11-24 ENCOUNTER — Ambulatory Visit (INDEPENDENT_AMBULATORY_CARE_PROVIDER_SITE_OTHER): Payer: Medicare (Managed Care) | Admitting: Family Medicine

## 2020-11-24 ENCOUNTER — Encounter: Payer: Self-pay | Admitting: Cardiology

## 2020-11-24 ENCOUNTER — Encounter: Payer: Self-pay | Admitting: Family Medicine

## 2020-11-24 ENCOUNTER — Ambulatory Visit: Payer: Medicare (Managed Care) | Admitting: Cardiology

## 2020-11-24 ENCOUNTER — Ambulatory Visit (INDEPENDENT_AMBULATORY_CARE_PROVIDER_SITE_OTHER): Payer: Medicare (Managed Care)

## 2020-11-24 VITALS — BP 128/92 | HR 53 | Ht 67.0 in | Wt 163.8 lb

## 2020-11-24 VITALS — BP 130/88 | HR 61 | Ht 67.0 in | Wt 163.8 lb

## 2020-11-24 DIAGNOSIS — Z23 Encounter for immunization: Secondary | ICD-10-CM | POA: Diagnosis not present

## 2020-11-24 DIAGNOSIS — R55 Syncope and collapse: Secondary | ICD-10-CM | POA: Diagnosis not present

## 2020-11-24 DIAGNOSIS — B1920 Unspecified viral hepatitis C without hepatic coma: Secondary | ICD-10-CM

## 2020-11-24 DIAGNOSIS — R768 Other specified abnormal immunological findings in serum: Secondary | ICD-10-CM

## 2020-11-24 DIAGNOSIS — R413 Other amnesia: Secondary | ICD-10-CM | POA: Diagnosis not present

## 2020-11-24 NOTE — Patient Instructions (Addendum)
Medication Instructions:  Your physician recommends that you continue on your current medications as directed. Please refer to the Current Medication list given to you today.  Labwork: None ordered.  Testing/Procedures: Your physician has recommended that you wear a holter monitor. Holter monitors are medical devices that record the heart's electrical activity. Doctors most often use these monitors to diagnose arrhythmias. Arrhythmias are problems with the speed or rhythm of the heartbeat. The monitor is a small, portable device. You can wear one while you do your normal daily activities. This is usually used to diagnose what is causing palpitations/syncope (passing out).  You will wear a ZIO monitor for 14 days.  Follow-Up: Your physician wants you to follow-up in: 6-8 weeks with Dr. Lalla Brothers.    January 05, 2021 at 11:45 am at the [facility] office   Any Other Special Instructions Will Be Listed Below (If Applicable).  If you need a refill on your cardiac medications before your next appointment, please call your pharmacy.

## 2020-11-24 NOTE — Progress Notes (Signed)
Electrophysiology Office Note:    Date:  11/24/2020   ID:  Chad Daniel, DOB 25-Mar-1956, MRN [id number]  PCP:  Sandre Kitty, MD  [facility] Cardiologist:  No primary care provider on file.  [facility] Electrophysiologist:  Lanier Prude, MD   Referring MD: No ref. provider found   Chief Complaint: Syncope  History of Present Illness:    Chad Daniel is a 64 y.o. male who presents for an evaluation of syncope at the request of Dr. Constance Goltz. Their medical history includes memory loss, hypertension, hyperlipidemia, B12 deficiency, hepatitis C infection.  Patient was recently hospitalized after a syncopal episode while he was getting a haircut.  He was seated the entire time and in the presence of his brother-in-law.  According to bystanders, the patient was seen to slump over and was unresponsive for 1 to 2 minutes.  He was brought to the hospital where neurology was consulted.  MRI, CT of the head and neck and EEG were performed and found to be normal.  Patient previously lived in [place] but was recently brought to [facility] given his worsening memory loss.  He does remember having at least one syncopal episode prior to arriving in [place] but can remember no details surrounding the event.  He is recently established with a primary care physician who is setting him up with infectious disease for treatment for his hepatitis C.  He is in clinic today with his ex-wife.  Past Medical History:  Diagnosis Date  . Essential hypertension 10/13/2020  . IV drug abuse, History of     No past surgical history on file.  Current Medications: Current Meds  Medication Sig  . Cyanocobalamin (B-12) 5000 MCG CAPS Please take 1 capsule daily.     Allergies:   Patient has no known allergies.   Social History   Socioeconomic History  . Marital status: Married    Spouse name: Not on file  . Number of children: Not on file  . Years of education: Not on file  . Highest  education level: Not on file  Occupational History  . Not on file  Tobacco Use  . Smoking status: Heavy Tobacco Smoker  . Smokeless tobacco: Never Used  Substance and Sexual Activity  . Alcohol use: Never  . Drug use: Never  . Sexual activity: Not on file  Other Topics Concern  . Not on file  Social History Narrative  . Not on file   Social Determinants of Health   Financial Resource Strain:   . Difficulty of Paying Living Expenses: Not on file  Food Insecurity:   . Worried About Programme researcher, broadcasting/film/video in the Last Year: Not on file  . Ran Out of Food in the Last Year: Not on file  Transportation Needs:   . Lack of Transportation (Medical): Not on file  . Lack of Transportation (Non-Medical): Not on file  Physical Activity:   . Days of Exercise per Week: Not on file  . Minutes of Exercise per Session: Not on file  Stress:   . Feeling of Stress : Not on file  Social Connections:   . Frequency of Communication with Friends and Family: Not on file  . Frequency of Social Gatherings with Friends and Family: Not on file  . Attends Religious Services: Not on file  . Active Member of Clubs or Organizations: Not on file  . Attends Banker Meetings: Not on file  . Marital Status: Not on file  Family History: The patient's family history includes Hypertension in his father and mother.  ROS:   Please see the history of present illness.    All other systems reviewed and are negative.  EKGs/Labs/Other Studies Reviewed:    The following studies were reviewed today: Hospitalization records, echo  October 13, 2020 echo personally reviewed Left ventricular function normal, 60% Right ventricular function normal no significant valvular abnormalities    Recent Labs: 10/13/2020: ALT 25; TSH 1.689 10/15/2020: BUN 11; Creatinine, Ser 0.94; Hemoglobin 12.6; Platelets 180; Potassium 3.5; Sodium 138  Recent Lipid Panel    Component Value Date/Time   CHOL 177 10/13/2020  1333   TRIG 67 10/13/2020 1333   HDL 43 10/13/2020 1333   CHOLHDL 4.1 10/13/2020 1333   VLDL 13 10/13/2020 1333   LDLCALC 121 (H) 10/13/2020 1333    Physical Exam:    VS:  BP (!) 128/92   Pulse (!) 53   Ht 5\' 7"  (1.702 m)   Wt 163 lb 12.8 oz (74.3 kg)   SpO2 93%   BMI 25.65 kg/m     Wt Readings from Last 3 Encounters:  11/24/20 163 lb 12.8 oz (74.3 kg)  11/24/20 163 lb 12.8 oz (74.3 kg)  10/15/20 155 lb 4.8 oz (70.4 kg)     GEN:  Well nourished, well developed in no acute distress HEENT: Normal NECK: No JVD; No carotid bruits LYMPHATICS: No lymphadenopathy CARDIAC: RRR, no murmurs, rubs, gallops RESPIRATORY:  Clear to auscultation without rales, wheezing or rhonchi  ABDOMEN: Soft, non-tender, non-distended MUSCULOSKELETAL:  No edema; No deformity  SKIN: Warm and dry NEUROLOGIC:  Alert and oriented x 3 PSYCHIATRIC:  Normal affect   ASSESSMENT:    1. Syncope, unspecified syncope type    PLAN:    In order of problems listed above:  1. Syncope Patient with a concerning episode of syncope while seated at home getting a haircut.  Unclear if this is related to arrhythmia.  We will need to order a 2-week ZIO monitor to assess for any abnormal arrhythmia-related causes of syncope.  If the 2-week monitor is negative for arrhythmias, favor implantation of a loop recorder for ongoing surveillance of cardiac arrhythmias.  I will plan on seeing him back in 6 to 8 weeks to review the results.  2.  Hepatitis C Has recently been seen by primary care physician who is setting up infectious disease consultation    Medication Adjustments/Labs and Tests Ordered: Current medicines are reviewed at length with the patient today.  Concerns regarding medicines are outlined above.  Orders Placed This Encounter  Procedures  . LONG TERM MONITOR (3-14 DAYS)  . EKG 12-Lead   No orders of the defined types were placed in this encounter.    Signed, 10/17/20, MD, [facility]  11/24/2020  5:19 PM    Electrophysiology [facility]

## 2020-11-24 NOTE — Patient Instructions (Signed)
It was nice to see you today,  I will put in the referral for the neuropsychiatric evaluation at the physical medicine and rehabilitation doctors.  Someone should call you within a week.  We will give you the flu vaccine and the pneumococcal vaccine today.  I would like to see you back in 3 months or sooner if you would like.  Please go to your appointments with the cardiologist and the infectious disease doctor.  Have a great day,  Frederic Jericho, MD

## 2020-11-25 ENCOUNTER — Telehealth: Payer: Self-pay | Admitting: *Deleted

## 2020-11-25 ENCOUNTER — Telehealth: Payer: Self-pay

## 2020-11-25 NOTE — Assessment & Plan Note (Signed)
Patient will follow up with infectious disease specialist where he will receive treatment for his hepatitis C.

## 2020-11-25 NOTE — Telephone Encounter (Signed)
RCID Patient Product/process development scientist completed.    The patient is insured through [employer].   MAVYRET will be the cheapest on patient insurance and none of the Hep-C meds require a PA , however we will need to get [employer] to help with the Co-Pay.  I will need to see patient to get HHS & Income.  We will continue to follow to see if copay assistance is needed.  [name], CPhT Specialty Pharmacy Patient [facility] Phone: [phone] Fax:  [phone]

## 2020-11-25 NOTE — Assessment & Plan Note (Signed)
Unsure what is causing the patient's cognitive impairment.  MRI of brain in the hospital was unrevealing.  Lab work thus far is also unrevealing.  Discussed with patient and wife the different kinds of dementia and how they present.  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  Will have patient formally evaluated with neuropsychiatric testing by PM&R specialist.

## 2020-11-25 NOTE — Telephone Encounter (Signed)
Pt in office to establish care and presented 2 vaccine cards for the covid vaccine.  One card with 2 dates filled in but only one documented with lot, date, and site that gave it, 2nd card with lot, date, and site.  Discussed with pt and his caregiver and we are only documenting the 2 dates with lot, date and site.  Will scan a copy of pts cards to go in file.  Pt told he could get booster at his next visit if he so desires since it will be >36mos from last date listed. Eyob Godlewski Zimmerman Rumple, CMA

## 2020-11-25 NOTE — Assessment & Plan Note (Signed)
No syncopal episodes since discharge.  Will get long-term monitor placed when he speaks with a cardiologist.

## 2020-11-28 ENCOUNTER — Encounter: Payer: Self-pay | Admitting: Infectious Diseases

## 2020-11-28 ENCOUNTER — Other Ambulatory Visit: Payer: Self-pay

## 2020-11-28 ENCOUNTER — Ambulatory Visit (INDEPENDENT_AMBULATORY_CARE_PROVIDER_SITE_OTHER): Payer: Medicare (Managed Care) | Admitting: Infectious Diseases

## 2020-11-28 VITALS — BP 140/90 | HR 72 | Resp 16 | Ht 67.0 in | Wt 162.7 lb

## 2020-11-28 DIAGNOSIS — B182 Chronic viral hepatitis C: Secondary | ICD-10-CM | POA: Insufficient documentation

## 2020-11-28 DIAGNOSIS — Z23 Encounter for immunization: Secondary | ICD-10-CM

## 2020-11-28 HISTORY — DX: Chronic viral hepatitis C: B18.2

## 2020-11-28 NOTE — Patient Instructions (Signed)
Hepatitis C What is hepatitis C?Hepatitis C is a disease that harms the liver. The liver is a big organ in the upper right side of the belly (figure 1). A virus causes this disease. The virus is called the hepatitis C virus. It spreads from person to person through contact with blood. This can happen in a few ways, including sharing drug needles. What are the symptoms of hepatitis C?Most people with hepatitis C have no symptoms. When symptoms do occur, they can include: ?Feeling tired or weak ?Lack of hunger ?Nausea ?Muscle or joint aches ?Weight loss In most cases, hepatitis C lasts for many years. That can lead to liver scarring, called "cirrhosis." Many people with cirrhosis have no symptoms. When symptoms do occur, they can include: ?Swelling in the belly and legs, and fluid build-up in the lungs  ?Bruising or bleeding easily ?Trouble taking in a full breath ?Feeling full in the belly ?Yellowing of the skin or whites of the eyes, called jaundice ?Confusion that can come on suddenly ?Coma How did I get the disease?You can catch the hepatitis C virus if you have contact with the blood of someone who is infected. This can happen if you: ?Share drug needles or cocaine straws ?Use infected needles for tattooing, acupuncture, or piercings ?Share toothbrushes, razors, or other things that could have blood on them ?Got a blood transfusion in the [place] before 1990 (after that time, blood banks started testing donated blood for hepatitis C) You can catch the hepatitis C virus if you have sex with someone who is infected. But this does not happen very often.  A pregnant woman who is infected can also give hepatitis C to her baby. Some people who have hepatitis C do not remember how they were infected. In the [place], many people with hepatitis C were born between [birth year] and 1965. If you were born during these years, your doctor might want to test you for hepatitis C even if you did not do  any of the things that put you at risk of infection. Is there a test for hepatitis C?Yes. Your doctor might order a few tests: ?Blood tests can show: .If you have hepatitis C .What type of the virus you have (there are at least 6 types) .Which treatment will work best for you If you have hepatitis C, your doctor will also want to know if you have any liver scarring. Ways to check for scarring include: ?Blood tests ?Liver scan - This is a type of imaging test that can show how much scarring you have. Not all doctors have access to the machine that does the scan. ?Biopsy - For this test, a doctor puts a needle into your liver and takes out a small sample of tissue. The sample will show how bad the damage is. Most people with hepatitis C do not need this test.  How is hepatitis C treated?Treatment depends on what type of hepatitis C you have. There are different medicines to treat hepatitis C. Some of them only work on certain forms of the hepatitis C virus. You will have to take a combination of 2 or more medicines based on which virus you have. Treatment usually lasts 3 months. The medicines come in pill form. Your doctor can help you decide which medicines are right for you. Is there anything I can do to protect my liver?Yes, you can: ?Avoid alcohol ?Maintain a healthy weight ?Get vaccinated for hepatitis A and B ?Get vaccinated for pneumonia, the  flu, and other diseases ?Ask your doctor or nurse before taking any over-the-counter pain medicines (these medicines can sometimes damage the liver). ?Avoid marijuana What if I want to get pregnant?If you want to get pregnant, talk to your doctor or nurse first. About 1 in 20 women who have hepatitis C pass the virus on to the baby during pregnancy. That number goes up in women who are also infected with HIV. What will my life be like?Many people with hepatitis C are able to live normal lives. Treatment can cure the disease in almost all cases. If you  have hepatitis C, it is still safe to: ?Hug, kiss, and touch other people (but you can spread the infection through sex) ?Share forks, spoons, cups, and food ?Sneeze or cough ?Breastfeed

## 2020-11-28 NOTE — Progress Notes (Signed)
[facility]                                      [address], [place], [place], [address]                                               Phn. [phone]; Fax: [phone]                                                               Date: 11/28/20 Reason for Visit: Hepatitis C    HPI: Chad Daniel is a 64 y.o.old male with PMH of HTN, HLD, Syncope and HCV who is here for evaluation and management of Hepatitis C. Patient is accompanied by his ex-wife as she tells me he has memory loss. Patient and wife tells me that they knew about his hepatitis C positive status when he was recently hospitalised and was screened for HCV and was found to be positive. He was admitted 10/20-10/21 for evaluation of syncope. Work up was non revealing as to th eetiology with negative MRI, CTA head and neck, negative EEG. HIV and RPR non reactive . Vitamin B 12 was low at 204 and was thought to be the etiology. He was also seen by cardiology on 12/3 for syncope and is being worked up for arrhythmia.   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 Also has a tattoo in Rt arm. Denies blood transfusion, sexual contact with HCV positive partners. Denies sharing of toothbrushes/razors. No personal or family history of liver disease.  He has not received treatment to date.  ROS: Denies yellowish discoloration of sclera and skin, abdominal pain/distension, hematemesis.            Dcough, fever, chills, nightsweats, nausea, vomiting, diarrhea, constipation, weight loss, recent hospitalizations, rashes, joint complaints, shortness of breath, chest pain, headaches, dysuria .  Current Outpatient Medications on File Prior to Visit  Medication Sig Dispense Refill  . Cyanocobalamin (B-12) 5000 MCG CAPS Please take 1 capsule daily. 30 capsule 0   No current facility-administered medications on file prior to visit.      No Known Allergies  PMH: HTN, HLD, syncope and memory loss  Social History   Socioeconomic History  . Marital status: Married    Spouse name: Not on file  . Number of children: Not on file  . Years of education: Not on file  . Highest education level: Not on file  Occupational History  . Not on file  Tobacco Use  . Smoking status: Heavy Tobacco Smoker  . Smokeless tobacco: Never Used  Substance and Sexual Activity  . Alcohol use: Never  . Drug use: Never  . Sexual activity: Not on file  Other Topics Concern  . Not on file  Social History Narrative  . Not on file   Social Determinants of Health   Financial Resource Strain:   . Difficulty of Paying Living Expenses: Not on file  Food Insecurity:   . Worried  About Running Out of Food in the Last Year: Not on file  . Ran Out of Food in the Last Year: Not on file  Transportation Needs:   . Lack of Transportation (Medical): Not on file  . Lack of Transportation (Non-Medical): Not on file  Physical Activity:   . Days of Exercise per Week: Not on file  . Minutes of Exercise per Session: Not on file  Stress:   . Feeling of Stress : Not on file  Social Connections:   . Frequency of Communication with Friends and Family: Not on file  . Frequency of Social Gatherings with Friends and Family: Not on file  . Attends Religious Services: Not on file  . Active Member of Clubs or Organizations: Not on file  . Attends Banker Meetings: Not on file  . Marital Status: Not on file  Intimate Partner Violence:   . Fear of Current or Ex-Partner: Not on file  . Emotionally Abused: Not on file  . Physically Abused: Not on file  . Sexually Abused: Not on file     Physical exam: BP 140/90   Pulse 72   Resp 16   Ht 5\' 7"  (1.702 m)   Wt 162 lb 11.2 oz (73.8 kg)   SpO2 98%   BMI 25.48 kg/m   Gen: Alert and oriented x 3, no acute distress HEENT: [place]/AT, PERL, no scl eral icterus, no pale conjunctivae, hearing  normal, oral mucosa moist Neck: Supple, no lymphadenopathy Cardio: Regular rate and rhythm; +S1 and S2; no murmurs, gallops, or rubs Resp: CTAB; no wheezes, rhonchi, or rales GI: Soft, nontender, nondistended, bowel sounds present GU: Musc: no joint swelling/tenderness Extremities: No cyanosis, clubbing, or edema; +2 PT and DP pulses Skin: No rashes, lesions, or ecchymoses Neuro: No focal deficits Psych: Calm, cooperative   Laboratory  10/12/20 Hep A IgM NR, hep B surface antigen NR, hep B core IgM NR, HCV ab Reactive, HIV NR  10/13/20 HCV genotype 2b  10/14/20 HCV 3,300,000  CBC Latest Ref Rng & Units 10/15/2020 10/14/2020 10/13/2020  WBC 4.0 - 10.5 K/uL 5.3 4.8 5.7  Hemoglobin 13.0 - 17.0 g/dL 12.6(L) 12.1(L) 12.2(L)  Hematocrit 39 - 52 % 38.9(L) 37.2(L) 38.6(L)  Platelets 150 - 400 K/uL 180 179 181   CMP Latest Ref Rng & Units 10/15/2020 10/14/2020 10/13/2020  Glucose 70 - 99 mg/dL 10/15/2020) 614(E) 99  BUN 8 - 23 mg/dL 11 10 10   Creatinine 0.61 - 1.24 mg/dL 315(Q 0.08  Sodium 135 - 145 mmol/L 138 137 138  Potassium 3.5 - 5.1 mmol/L 3.5 3.6 3.9  Chloride 98 - 111 mmol/L 105 104 105  CO2 22 - 32 mmol/L 25 26 26   Calcium 8.9 - 10.3 mg/dL 8.9 6.76) 1.95)  Total Protein 6.5 - 8.1 g/dL - - 6.7  Total Bilirubin 0.3 - 1.2 mg/dL - - 0.7  Alkaline Phos 38 - 126 U/L - - 69  AST 15 - 41 U/L - - 26  ALT 0 - 44 U/L - - 25    Assessment/Plan:  1. Hepatitis C - Likely 2/2 IVDU in the past  -Discussed the pathogenesis, transmission, prevention, risks of left untreated, and treatment options for hepatitis C -Will get following Orders Placed This Encounter  Procedures  . ABDOMEN COMPLETE W/ELASTOGRAPHY  . Hepatitis A antibody, total  . Hepatitis B core antibody, total  . Hepatitis B surface antibody,qualitative  . Protime-INR  . Liver Fibrosis, FibroTest-ActiTest   Smoking Counseled Denies  alcohol   Health Maintenance/Counseling Avoid Alcohol  Patient says he  has received Flu vaccines and both doses of COVID vaccine and is scheduled to get a booster soon Will discuss vaccinations for Hep A and B pending serologies   I spent greater than 45  minutes with the patient including review of prior medical records with greater than 50% of time in face to face counsel of the patient.   Patient's labs were reviewed as well as his previous records. Patients questions were addressed and answered.   Electronically signed by:  [name], MD Infectious Disease Physician [facility] [address] [place], [place] [address] Phone: [phone]  Fax: [phone]

## 2020-11-28 NOTE — Assessment & Plan Note (Signed)
Will get additional HCV labs for starting treatment FU in 1 month with Cassie

## 2020-11-28 NOTE — Assessment & Plan Note (Signed)
Has received Flu vaccines and 2 doses of COVID vaccine, scheduled to get booster Discussed about Hep A and B vaccines, will wait for Hep A and B serologies

## 2020-12-02 LAB — LIVER FIBROSIS, FIBROTEST-ACTITEST
ALT: 40 U/L (ref 9–46)
Alpha-2-Macroglobulin: 385 mg/dL — ABNORMAL HIGH (ref 106–279)
Apolipoprotein A1: 135 mg/dL (ref 94–176)
Bilirubin: 0.5 mg/dL (ref 0.2–1.2)
Fibrosis Score: 0.67
GGT: 21 U/L (ref 3–70)
Haptoglobin: 77 mg/dL (ref 43–212)
Necroinflammat ACT Score: 0.33
Reference ID: 3659579

## 2020-12-02 LAB — PROTIME-INR
INR: 1
Prothrombin Time: 11 s (ref 9.0–11.5)

## 2020-12-02 LAB — HEPATITIS A ANTIBODY, TOTAL: Hepatitis A AB,Total: REACTIVE — AB

## 2020-12-02 LAB — HEPATITIS B CORE ANTIBODY, TOTAL: Hep B Core Total Ab: REACTIVE — AB

## 2020-12-02 LAB — HEPATITIS B SURFACE ANTIBODY,QUALITATIVE: Hep B S Ab: BORDERLINE — AB

## 2020-12-06 ENCOUNTER — Ambulatory Visit (HOSPITAL_COMMUNITY): Payer: Medicare (Managed Care)

## 2020-12-27 ENCOUNTER — Encounter: Payer: Self-pay | Admitting: Psychology

## 2020-12-28 ENCOUNTER — Other Ambulatory Visit: Payer: Self-pay

## 2020-12-28 ENCOUNTER — Ambulatory Visit (HOSPITAL_COMMUNITY)
Admission: RE | Admit: 2020-12-28 | Discharge: 2020-12-28 | Disposition: A | Discharge status: Home / Self Care | Payer: Medicare (Managed Care) | Source: Ambulatory Visit | Attending: Infectious Diseases | Admitting: Infectious Diseases

## 2020-12-28 DIAGNOSIS — B182 Chronic viral hepatitis C: Secondary | ICD-10-CM | POA: Insufficient documentation

## 2020-12-28 NOTE — Progress Notes (Signed)
Patient called. Left voicemail to return call to office for follow up with Dr. Elinor Parkinson. Will continue to follow.  Chad Daniel

## 2021-01-02 NOTE — Progress Notes (Signed)
Patient scheduled.

## 2021-01-03 ENCOUNTER — Encounter: Payer: Self-pay | Admitting: Infectious Diseases

## 2021-01-04 ENCOUNTER — Encounter: Payer: Self-pay | Admitting: Infectious Diseases

## 2021-01-04 ENCOUNTER — Telehealth: Payer: Self-pay

## 2021-01-04 ENCOUNTER — Ambulatory Visit (INDEPENDENT_AMBULATORY_CARE_PROVIDER_SITE_OTHER): Payer: Medicare (Managed Care) | Admitting: Infectious Diseases

## 2021-01-04 ENCOUNTER — Other Ambulatory Visit: Payer: Self-pay

## 2021-01-04 VITALS — BP 131/85 | HR 84 | Wt 164.8 lb

## 2021-01-04 DIAGNOSIS — B182 Chronic viral hepatitis C: Secondary | ICD-10-CM

## 2021-01-04 DIAGNOSIS — Z7185 Encounter for immunization safety counseling: Secondary | ICD-10-CM | POA: Diagnosis not present

## 2021-01-04 DIAGNOSIS — B181 Chronic viral hepatitis B without delta-agent: Secondary | ICD-10-CM | POA: Diagnosis not present

## 2021-01-04 DIAGNOSIS — Z8619 Personal history of other infectious and parasitic diseases: Secondary | ICD-10-CM | POA: Insufficient documentation

## 2021-01-04 NOTE — Assessment & Plan Note (Signed)
Will plan on treatment pending work up for Hepatitis B given labs suggestive of prior Hep B infection

## 2021-01-04 NOTE — Progress Notes (Signed)
[facility]                                      [address], [place], [place], [address]                                               Phn. [phone]; Fax: [phone]                                                               Date: 01/04/21  Reason for Visit: Hepatitis C/hepatitis B  Subjective/Interval Events  Chad Daniel is a 65 y.o.old male with PMH of HTN, HLD, Syncope and HCV who is here for up of lab results.  Unfortunately, he was tested positive for Hepatitis B Core ab total when I checked it last clinic visit on 11/28/20 and hence, he needs more labs for evaluation of Hepatitis B today.   Risk Factors for hepatitis B : past h/o IVDU, tattoos. Denies being sexually active currently. Denies h/o known sexual partners with hepatitis B  Ex Wife is with the patient. She says that he has not had good appetite but his ex wife makes sure he eats. Complains of bilateral knee and back pain. Sleeps a lot Denies vomiting blood or dark tarry stool  Denies abdominal distension Denies changes in weight Denies skin rashes but has itching  Denies recent hospital admission  Neurology appt on  1/18 Discussed about getting COVID Booster   ROS: Denies yellowish discoloration of sclera and skin, abdominal pain/distension, hematemesis.            Denies cough, fever, chills, nightsweats, nausea, vomiting, diarrhea, constipation, weight loss, recent hospitalizations, rashes, shortness of breath, chest pain, headaches, dysuria .  Current Outpatient Medications on File Prior to Visit  Medication Sig Dispense Refill  . Cyanocobalamin (B-12) 5000 MCG CAPS Please take 1 capsule daily. 30 capsule 0   No current facility-administered medications on file prior to visit.     No Known Allergies  Past Medical History:  Diagnosis Date  . Essential hypertension 10/13/2020  . IV drug abuse, History of    No Known  Allergies  Social History   Socioeconomic History  . Marital status: Legally Separated    Spouse name: Not on file  . Number of children: Not on file  . Years of education: Not on file  . Highest education level: Not on file  Occupational History  . Not on file  Tobacco Use  . Smoking status: Heavy Tobacco Smoker  . Smokeless tobacco: Never Used  Substance and Sexual Activity  . Alcohol use: Never  . Drug use: Never  . Sexual activity: Not on file  Other Topics Concern  . Not on file  Social History Narrative   ** Merged History Encounter **       Social Determinants of Health   Financial Resource Strain: Not on file  Food Insecurity: Not on file  Transportation Needs: Not on file  Physical Activity: Not on file  Stress: Not on file  Social  Connections: Not on file  Intimate Partner Violence: Not on file   Vitals  BP 131/85   Pulse 84   Wt 164 lb 12.8 oz (74.8 kg)   BMI 25.81 kg/m   Physical exam: Wt 164 lb 12.8 oz (74.8 kg)   BMI 25.81 kg/m   Gen: Alert and oriented x 3, no acute distress HEENT: [place]/AT, PERL, no scleral icterus, no pale conjunctivae, hearing normal, oral mucosa moist Neck: Supple, no lymphadenopathy Cardio: Regular rate and rhythm; +S1 and S2; no murmurs, gallops, or rubs Resp: CTAB; no wheezes, rhonchi, or rales GI: Soft, nontender, nondistended, bowel sounds present GU: Musc: Extremities: No cyanosis, clubbing, or edema; +2 PT and DP pulses Skin: No rashes, lesions, or ecchymoses Neuro: No focal deficits Psych: Calm, cooperative  Assessment/Plan: Chronic Hepatitis C Prior treatment: None  GT: 2 b Evidence of cirrhosis: F3 on Fibrosure, [place] elastography - no evidence of cirrhosis. No evidence of cirrhosis on exam and labs  Interested in treatment: yes Potential DDI: Discussed   Prior Hepatitis B Infection  Will get further labs to evaluate status of Hepatitis B Orders Placed This Encounter  Procedures  . Hepatitis B core  antibody, IgM  . Hepatitis B DNA, ultraquantitative, PCR  . Hepatitis B e antibody  . Hepatitis B e antigen   Preventive measures discussed  Have household and sexual contacts vaccinated Use barrier protection during sexual intercourse if partner is not vaccinated or is not naturally immune Not share toothbrushes or razors Not share injection equipment Not share glucose testing equipment Cover open cuts and scratches Clean blood spills with bleach solution Not donate blood, organs, or sperm  Plan: Will review labs from today for evaluation of Hepatitis B and schedule an follow upappointment with Cassie for treatment.   I spent greater than 25 minutes with the patient including review of prior medical records with greater than 50% of time in face to face counsel of the patient.   Patient's labs were reviewed as well as his previous records. Patients questions were addressed and answered.   Electronically signed by:  [name], MD Infectious Diseases  Office phone [phone] Fax no. [phone]

## 2021-01-04 NOTE — Assessment & Plan Note (Signed)
Counseled on COVID Booster 

## 2021-01-04 NOTE — Assessment & Plan Note (Signed)
Will get Hep B e antigen, e antibody, hep B DNA today

## 2021-01-04 NOTE — Telephone Encounter (Signed)
[name]  Patient insurance will pay for Epclusa (generic) & Mavyret the Co-Pay is $3000.00. I will apply for PAN once the patient email me his income letter.  Prescription can be filled at [facility].  [name], [name] Phone: [phone] Fax:  [phone]

## 2021-01-05 ENCOUNTER — Telehealth: Payer: Self-pay

## 2021-01-05 ENCOUNTER — Ambulatory Visit (INDEPENDENT_AMBULATORY_CARE_PROVIDER_SITE_OTHER): Payer: Medicare (Managed Care) | Admitting: Cardiology

## 2021-01-05 ENCOUNTER — Encounter: Payer: Self-pay | Admitting: Cardiology

## 2021-01-05 VITALS — BP 128/76 | HR 90 | Ht 67.0 in | Wt 165.4 lb

## 2021-01-05 DIAGNOSIS — I471 Supraventricular tachycardia, unspecified: Secondary | ICD-10-CM

## 2021-01-05 DIAGNOSIS — R55 Syncope and collapse: Secondary | ICD-10-CM

## 2021-01-05 NOTE — Telephone Encounter (Signed)
-----   Message from [name], MD sent at 01/05/2021 11:19 AM EST ----- Regarding: Labs Can you please make an appointment for lab visit ( hepatitis delta antibody) for this patient? He is scheduled to see a provider today. He can get it done there as well if he is willing. Thanks

## 2021-01-05 NOTE — Patient Instructions (Signed)
Medication Instructions:  Your physician recommends that you continue on your current medications as directed. Please refer to the Current Medication list given to you today.  Labwork: None ordered.  Testing/Procedures: None ordered.  Follow-Up:  Need to consider for loop implant.   You will contact your insurance provider.  If they are not able to approve your advantage plan with our office you will call me and we will work with MSW to see if we can get you assistance.  Any Other Special Instructions Will Be Listed Below (If Applicable).  If you need a refill on your cardiac medications before your next appointment, please call your pharmacy.

## 2021-01-05 NOTE — Telephone Encounter (Signed)
Call placed to patient, no answer. Patient has appointment today at 1145 with Dr. Lalla Brothers.  LPN called office gave order to draw Hepatitis delta antibody if patient is willing per Dr. Elinor Parkinson.  Valarie Cones

## 2021-01-05 NOTE — Telephone Encounter (Signed)
RN spoke to [facility] at [facility], they will have patient go to the [facility] on the first floor of their building after his appointment. Faxed lab orders to [facility] at [phone] per Dr. Elinor Parkinson.   Sandie Ano, RN

## 2021-01-05 NOTE — Progress Notes (Signed)
Electrophysiology Office Follow up Visit Note:    Date:  01/05/2021   ID:  Chad Daniel, DOB August 09, 1956, MRN [id number]  PCP:  Sandre Kitty, MD  [facility] Cardiologist:  No primary care provider on file.  [facility] Electrophysiologist:  Lanier Prude, MD    Interval History:    Chad Daniel is a 65 y.o. male who presents for a follow up visit.  I last saw him November 24, 2020 for syncope.  At that appointment we ordered a ZIO monitor which was completed December 27, 2020.  ZIO monitor showed burst of atrial tachycardia but no sustained arrhythmias or sustained atrial fibrillation.  There were rare ventricular ectopic beats.  Nothing was found on the monitor that would explain his concerning syncopal episode.  Patient tells me he has had no further episodes of syncope.  He has hepatitis C and is about to start treatment with infectious disease physician.  He is in clinic today with his family.     Past Medical History:  Diagnosis Date  . Essential hypertension 10/13/2020  . IV drug abuse, History of     History reviewed. No pertinent surgical history.  Current Medications: Current Meds  Medication Sig  . Cyanocobalamin (B-12) 5000 MCG CAPS Please take 1 capsule daily.     Allergies:   Patient has no known allergies.   Social History   Socioeconomic History  . Marital status: Legally Separated    Spouse name: Not on file  . Number of children: Not on file  . Years of education: Not on file  . Highest education level: Not on file  Occupational History  . Not on file  Tobacco Use  . Smoking status: Heavy Tobacco Smoker  . Smokeless tobacco: Never Used  Substance and Sexual Activity  . Alcohol use: Never  . Drug use: Never  . Sexual activity: Not on file  Other Topics Concern  . Not on file  Social History Narrative   ** Merged History Encounter **       Social Determinants of Health   Financial Resource Strain: Not on file  Food Insecurity: Not  on file  Transportation Needs: Not on file  Physical Activity: Not on file  Stress: Not on file  Social Connections: Not on file     Family History: The patient's family history includes Hypertension in his father and mother.  ROS:   Please see the history of present illness.    All other systems reviewed and are negative.  EKGs/Labs/Other Studies Reviewed:    The following studies were reviewed today:  December 27, 2020 ZIO personally reviewed HR 41-245, average 64bpm. Sinus HR 41-158, average 64bpm. 441 episodes of SVT, longest lasting 18.9 seconds with a rate of 135bpm. Fastest episode lasted 8 beats at 245bpm. Review of rhythm strips shows likely AT. Rare ventricular ectopy.   EKG:  The ekg ordered today demonstrates sinus rhythm  Recent Labs: 10/13/2020: TSH 1.689 10/15/2020: BUN 11; Creatinine, Ser 0.94; Hemoglobin 12.6; Platelets 180; Potassium 3.5; Sodium 138 11/28/2020: ALT 40  Recent Lipid Panel    Component Value Date/Time   CHOL 177 10/13/2020 1333   TRIG 67 10/13/2020 1333   HDL 43 10/13/2020 1333   CHOLHDL 4.1 10/13/2020 1333   VLDL 13 10/13/2020 1333   LDLCALC 121 (H) 10/13/2020 1333    Physical Exam:    VS:  BP 128/76   Pulse 90   Ht 5\' 7"  (1.702 m)   Wt 165 lb 6.4  oz (75 kg)   SpO2 98%   BMI 25.91 kg/m     Wt Readings from Last 3 Encounters:  01/05/21 165 lb 6.4 oz (75 kg)  01/04/21 164 lb 12.8 oz (74.8 kg)  11/28/20 162 lb 11.2 oz (73.8 kg)     GEN:  Well nourished, well developed in no acute distress HEENT: Normal NECK: No JVD; No carotid bruits LYMPHATICS: No lymphadenopathy CARDIAC: RRR, no murmurs, rubs, gallops RESPIRATORY:  Clear to auscultation without rales, wheezing or rhonchi  ABDOMEN: Soft, non-tender, non-distended MUSCULOSKELETAL:  No edema; No deformity  SKIN: Warm and dry NEUROLOGIC:  Alert and oriented x 3 PSYCHIATRIC:  Normal affect   ASSESSMENT:    1. Syncope, unspecified syncope type   2. SVT (supraventricular  tachycardia) (HCC)    PLAN:    In order of problems listed above:  1. Syncope Patient with a history of syncope concerning for a severe arrhythmia.  Patient was sitting down when he abruptly lost consciousness and fell over forward.  I still am concerned that that episode represents a malignant arrhythmia.  Given his negative ZIO monitor I would like to proceed with implanting a loop recorder for ongoing surveillance for arrhythmias.  We will work to get this scheduled.  2.  SVT  ZIO monitor showed brief bursts of probable atrial tachycardia.  No sustained arrhythmias.  Loop recorder will provide ongoing surveillance for progression of his atrial arrhythmias.  In the meantime would like to start metoprolol succinate 25 mg once daily to see if this will be effective in suppressing these arrhythmias.   Medication Adjustments/Labs and Tests Ordered: Current medicines are reviewed at length with the patient today.  Concerns regarding medicines are outlined above.  No orders of the defined types were placed in this encounter.  No orders of the defined types were placed in this encounter.    Signed, Steffanie Dunn, MD, [facility]  01/05/2021 6:16 PM    Electrophysiology [facility]

## 2021-01-09 LAB — HEPATITIS B CORE ANTIBODY, IGM: Hep B C IgM: NONREACTIVE

## 2021-01-09 LAB — HEPATITIS B E ANTIGEN: Hep B E Ag: NONREACTIVE

## 2021-01-09 LAB — HEPATITIS B DNA, ULTRAQUANTITATIVE, PCR
Hepatitis B DNA (Calc): <1 Log IU/mL
Hepatitis B DNA: <10 IU/mL

## 2021-01-09 LAB — HEPATITIS B E ANTIBODY: Hep B E Ab: NONREACTIVE

## 2021-01-10 ENCOUNTER — Encounter: Payer: Medicare (Managed Care) | Admitting: Psychology

## 2021-01-25 ENCOUNTER — Telehealth: Payer: Self-pay

## 2021-01-25 ENCOUNTER — Other Ambulatory Visit: Payer: Self-pay | Admitting: Infectious Diseases

## 2021-01-25 MED ORDER — SOFOSBUVIR-VELPATASVIR 400-100 MG PO TABS: 1.0000 | ORAL_TABLET | Freq: Every day | ORAL | 2 refills | Status: DC

## 2021-01-25 NOTE — Telephone Encounter (Signed)
[name]   I was successful in securing patient an $5600.00 grant from [employer] ([employer]) to provide copayment coverage for Epclusa.  This will make the out of pocket cost $0.00.     I have spoken with the patient.    The billing information is as follows and has been shared with [facility].         Patient knows to call the office with questions or concerns.  Shaquanta Harkless, CPhT Specialty Pharmacy Patient [facility] Phone: [phone] Fax:  [phone]  

## 2021-01-26 MED FILL — SOFOSBUVIR-VELPATASVIR 400-: 400-100 | 28 days supply | Qty: 28 | Fill #0

## 2021-01-27 ENCOUNTER — Telehealth: Payer: Self-pay

## 2021-01-27 NOTE — Telephone Encounter (Signed)
Spoke with patient's wife. They have not checked the mail today, but LPN did make her aware that package was shipped out yesterday.  Also made wife aware of how to take medication and not miss any doses.   Reminded patient of f/u visit.  Chad Daniel

## 2021-01-27 NOTE — Telephone Encounter (Signed)
-----   Message from [name], MD sent at 01/27/2021  7:24 AM EST ----- Regarding: RE: Dorita Fray I have sent the prescription to [facility]. Per Lupita Leash, it is available to pick up. Could you please check if he has picked up the prescription ?   Also sending a reminder on how to take the medicine Dorita Fray) 1 tablet once a daily with or without food, preferably at the same time of the day Do not miss any doses  If you need to take any new medicines like antacids, omeprazole, lansoprazole etc, let me know Fu has been made in 1 month    ----- Message ----- From: Bobette Mo, CPhT Sent: 01/25/2021   3:15 PM EST To: [name], MD Subject: Dorita Fray                                        Hello Dr. Elinor Parkinson,  I checked and patient is only taking vitamin B-12. You can send the script in to [facility]     Thank You,  [name], CPhT Specialty Pharmacy Patient [facility] for Infectious Disease Phone: [phone] Fax: [phone]

## 2021-01-27 NOTE — Telephone Encounter (Signed)
Confirmed with pharmacy medication was filled on 2/3 and shipped out to patient.  Called patient and left message to return call to [facility]. Chad Daniel

## 2021-01-27 NOTE — Telephone Encounter (Signed)
Thank you :)

## 2021-02-20 MED FILL — SOFOSBUVIR-VELPATASVIR 400-: 400-100 | 28 days supply | Qty: 28 | Fill #1

## 2021-02-28 ENCOUNTER — Encounter: Payer: Self-pay | Admitting: Infectious Diseases

## 2021-02-28 ENCOUNTER — Ambulatory Visit: Payer: Medicare (Managed Care) | Admitting: Infectious Diseases

## 2021-02-28 ENCOUNTER — Other Ambulatory Visit: Payer: Self-pay

## 2021-02-28 VITALS — BP 160/96 | HR 61 | Temp 97.8°F | Ht 68.0 in | Wt 171.0 lb

## 2021-02-28 DIAGNOSIS — Z5181 Encounter for therapeutic drug level monitoring: Secondary | ICD-10-CM

## 2021-02-28 DIAGNOSIS — B181 Chronic viral hepatitis B without delta-agent: Secondary | ICD-10-CM

## 2021-02-28 DIAGNOSIS — B182 Chronic viral hepatitis C: Secondary | ICD-10-CM

## 2021-02-28 NOTE — Progress Notes (Addendum)
[facility]                                                             [address], [place], [place], [address]                                                                  Phn. 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 However, Fibrotest has f3 score, FIB4 is 1.87  Plan Continue Epclusa for remaining period of 12 weeks duration. End date is around end of April 2022. Will plan to repeat Fibrotest in few months for assessing need for [facility].  Fu end of April around end of tx course  All questions and concerns were discussed and addressed. Patient verbalized understanding of the plan. ____________________________________________________________________________________________________________________ Subjective 02/28/21 Here for follow up of hepatitis C treatment. Started taking Epclusa approx 5 weeks ago 01/26/21. Patient is accompanied by wife who states that he has not missed any medications. Denies any new medicines. Denies any concerning side effects. Per wife, still has issues with memory. They got second shipment of Epclusa last Monday. No complaints today   ROS: Constitutional: Negative for fever, chills, activity change, appetite change, fatigue and unexpected weight change.  HENT: Negative for congestion, sore throat, rhinorrhea, sneezing, trouble swallowing and sinus pressure.  Eyes: Negative for photophobia and visual disturbance.  Respiratory: Negative for cough, chest tightness, shortness of breath, wheezing  and stridor.  Cardiovascular: Negative for chest pain, palpitations and leg swelling.  Gastrointestinal: Negative for nausea, vomiting, abdominal pain, diarrhea, constipation, blood in stool, abdominal distention and anal bleeding.  Genitourinary: Negative for dysuria, hematuria, flank pain and difficulty urinating.  Musculoskeletal: Negative for myalgias, back pain, joint swelling, arthralgias and gait problem.  Skin: Negative for color change, pallor, rash and wound.  Neurological: Negative for dizziness, tremors, weakness and light-headedness.  Hematological: Negative for adenopathy. Does not bruise/bleed easily.  Psychiatric/Behavioral: Negative for behavioral problems, sleep disturbance, dysphoric mood, decreased concentration and agitation.   Past Medical History:  Diagnosis Date  . Essential hypertension 10/13/2020  . IV drug abuse, History of    Current Outpatient Medications on File Prior to Visit  Medication Sig Dispense Refill  . Cyanocobalamin (B-12) 5000 MCG CAPS Please take 1 capsule daily. 30 capsule 0  .  Sofosbuvir-Velpatasvir (EPCLUSA) 400-100 MG TABS Take 1 tablet by mouth daily. Take 1 tablet by mouth daily. 28 tablet 2   No current facility-administered medications on file prior to visit.   No Known Allergies  Social History   Socioeconomic History  . Marital status: Legally Separated    Spouse name: Not on file  . Number of children: Not on file  . Years of education: Not on file  . Highest education level: Not on file  Occupational History  . Not on file  Tobacco Use  . Smoking status: Heavy Tobacco Smoker    Packs/day: 0.50    Types: Cigarettes  . Smokeless tobacco: Never Used  Substance and Sexual Activity  . Alcohol use: Never  . Drug use: Never  . Sexual activity: Not on file  Other Topics Concern  . Not on file  Social History Narrative   ** Merged History Encounter **       Social Determinants of Health   Financial Resource Strain: Not on  file  Food Insecurity: Not on file  Transportation Needs: Not on file  Physical Activity: Not on file  Stress: Not on file  Social Connections: Not on file  Intimate Partner Violence: Not on file     Vitals BP (!) 160/96   Pulse 61   Temp 97.8 F (36.6 C) (Oral)   Ht 5\' 8"  (1.727 m)   Wt 171 lb (77.6 kg)   SpO2 100%   BMI 26.00 kg/m    Examination  General - not in acute distress, comfortably sitting in chair HEENT - PEERLA, no pallor and no icterus Chest - b/l clear air entry, no additional sounds CVS- Normal s1s2, RRR Abdomen - Soft, Non tender , non distended Ext- no pedal edema Neuro: grossly normal Back - WNL Psych : calm and cooperative   Recent labs CBC Latest Ref Rng & Units 10/15/2020 10/14/2020 10/13/2020  WBC 4.0 - 10.5 K/uL 5.3 4.8 5.7  Hemoglobin 13.0 - 17.0 g/dL 12.6(L) 12.1(L) 12.2(L)  Hematocrit 39.0 - 52.0 % 38.9(L) 37.2(L) 38.6(L)  Platelets 150 - 400 K/uL 180 179 181   CMP Latest Ref Rng & Units 11/28/2020 10/15/2020 10/14/2020  Glucose 70 - 99 mg/dL - 10/16/2020) 751(W)  BUN 8 - 23 mg/dL - 11 10  Creatinine 258(N - 1.24 mg/dL - 2.77 8.24  Sodium 2.35 - 145 mmol/L - 138 137  Potassium 3.5 - 5.1 mmol/L - 3.5 3.6  Chloride 98 - 111 mmol/L - 105 104  CO2 22 - 32 mmol/L - 25 26  Calcium 8.9 - 10.3 mg/dL - 8.9 361)  Total Protein 6.5 - 8.1 g/dL - - -  Total Bilirubin 0.3 - 1.2 mg/dL - - -  Alkaline Phos 38 - 126 U/L - - -  AST 15 - 41 U/L - - -  ALT 9 - 46 U/L 40 - -     Pertinent Microbiology Results for orders placed or performed during the hospital encounter of 10/12/20  Respiratory Panel by RT PCR (Flu A&B, Covid) - Nasopharyngeal Swab     Status: None   Collection Time: 10/12/20  6:59 PM   Specimen: Nasopharyngeal Swab  Result Value Ref Range Status   SARS Coronavirus 2 by RT PCR NEGATIVE NEGATIVE Final    Comment: (NOTE) SARS-CoV-2 target nucleic acids are NOT DETECTED.  The SARS-CoV-2 RNA is generally detectable in upper  respiratoy specimens during the acute phase of infection. The lowest concentration of SARS-CoV-2 viral copies this assay can  detect is 131 copies/mL. A negative result does not preclude SARS-Cov-2 infection and should not be used as the sole basis for treatment or other patient management decisions. A negative result may occur with  improper specimen collection/handling, submission of specimen other than nasopharyngeal swab, presence of viral mutation(s) within the areas targeted by this assay, and inadequate number of viral copies (<131 copies/mL). A negative result must be combined with clinical observations, patient history, and epidemiological information. The expected result is Negative.  Fact Sheet for Patients:  https://www.moore.com/  Fact Sheet for Healthcare Providers:  https://www.young.biz/  This test is no t yet approved or cleared by the [place] FDA and  has been authorized for detection and/or diagnosis of SARS-CoV-2 by FDA under an Emergency Use Authorization (EUA). This EUA will remain  in effect (meaning this test can be used) for the duration of the COVID-19 declaration under Section 564(b)(1) of the Act, 21 U.S.C. section 360bbb-3(b)(1), unless the authorization is terminated or revoked sooner.     Influenza A by PCR NEGATIVE NEGATIVE Final   Influenza B by PCR NEGATIVE NEGATIVE Final    Comment: (NOTE) The Xpert Xpress SARS-CoV-2/FLU/RSV assay is intended as an aid in  the diagnosis of influenza from Nasopharyngeal swab specimens and  should not be used as a sole basis for treatment. Nasal washings and  aspirates are unacceptable for Xpert Xpress SARS-CoV-2/FLU/RSV  testing.  Fact Sheet for Patients: https://www.moore.com/  Fact Sheet for Healthcare Providers: https://www.young.biz/  This test is not yet approved or cleared by the [place] FDA and  has been  authorized for detection and/or diagnosis of SARS-CoV-2 by  FDA under an Emergency Use Authorization (EUA). This EUA will remain  in effect (meaning this test can be used) for the duration of the  Covid-19 declaration under Section 564(b)(1) of the Act, 21  U.S.C. section 360bbb-3(b)(1), unless the authorization is  terminated or revoked. Performed at [facility], [address], [place], [place] [address]       Pertinent Imaging FINDINGS: ULTRASOUND ABDOMEN  Gallbladder: No gallstones or wall thickening visualized. No sonographic Murphy sign noted by sonographer.  Common bile duct: Diameter: 5 mm  Liver: Subtle increased echogenicity of hepatic parenchyma. Lobulated contours of the liver without frank nodularity or visible lesion. Portal vein is patent on color Doppler imaging with normal direction of blood flow towards the liver.  IVC: Limited assessment of the IVC mainly on transverse images, characteristic longitudinal image limited by overlying bowel gas. Areas that are well seen are unremarkable.  Pancreas: Visualized portion unremarkable.  Spleen: Size and appearance within normal limits.  Right Kidney: Length: 10.5 cm. Echogenicity within normal limits. No mass or hydronephrosis visualized.  Left Kidney: Length: 10.7 cm. Focal scarring in the interpolar LEFT kidney, no hydronephrosis or visible lesion.  Abdominal aorta: No aneurysm visualized. Limited assessment proximally due to overlying bowel gas.  Other findings: None.  ULTRASOUND HEPATIC ELASTOGRAPHY  Device: Siemens Helix VTQ  Patient position: Supine  Transducer C5-1  Number of measurements: 10  Hepatic segment:  VIII  Median kPa: 4.3  IQR: 0.6  IQR/Median kPa ratio: 0.1  Data quality:  Good  Diagnostic category:  < or = 5 kPa: high probability of being normal  The use of hepatic elastography is applicable to patients with viral hepatitis and  non-alcoholic fatty liver disease. At this time, there is insufficient data for the referenced cut-off values and use in other causes of liver disease, including alcoholic liver disease.  Patients, however, may be assessed by elastography and serve as their own reference standard/baseline.  In patients with non-alcoholic liver disease, the values suggesting compensated advanced chronic liver disease (cACLD) may be lower, and patients may need additional testing with elasticity results of 7-9 kPa.  Please note that abnormal hepatic elasticity and shear wave velocities may also be identified in clinical settings other than with hepatic fibrosis, such as: acute hepatitis, elevated right heart and central venous pressures including use of beta blockers, veno-occlusive disease (Budd-Chiari), infiltrative processes such as mastocytosis/amyloidosis/infiltrative tumor/lymphoma, extrahepatic cholestasis, with hyperemia in the post-prandial state, and with liver transplantation. Correlation with patient history, laboratory data, and clinical condition recommended.  Diagnostic Categories:  < or =5 kPa: high probability of being normal  < or =9 kPa: in the absence of other known clinical signs, rules out cACLD  >9 kPa and ?13 kPa: suggestive of cACLD, but needs further testing  >13 kPa: highly suggestive of cACLD  > or =17 kPa: highly suggestive of cACLD with an increased probability of clinically significant portal hypertension  IMPRESSION: ULTRASOUND ABDOMEN:  Mild hepatic steatosis with mildly lobular hepatic contours, no frank signs of cirrhosis with normal size spleen.  Mildly limited assessment due to overlying bowel gas with respect to mid abdominal structures as described.  ULTRASOUND HEPATIC ELASTOGRAPHY:  Median kPa:  4.3  Diagnostic category:  < or = 5 kPa: high probability of being normal     All pertinent labs/Imagings/notes reviewed. All  pertinent plain films and CT images have been personally visualized and interpreted; radiology reports have been reviewed. Decision making incorporated into the Impression / Recommendations.  I spent 30 minutes with the patient including  review of prior medical records with greater than 50% of time in face to face counsel of the patient.    Electronically signed by:  [name], MD Infectious Disease Physician [facility] [address] [place], [place] [address] Phone: [phone]  Fax: [phone]

## 2021-03-19 IMAGING — CT CT ANGIO NECK
3 of 7 series · 10 of 36 positions shown · IV contrast (OMNI 350)
Comparison: Brain MRI 10/12/2020

CLINICAL DATA: Syncope, recurrent.  Syncope and memory deficits.

EXAM:
CT ANGIOGRAPHY HEAD AND NECK
TECHNIQUE: Multidetector CT imaging of the head and neck was performed using
the standard protocol during bolus administration of intravenous
contrast. Multiplanar CT image reconstructions and MIPs were
obtained to evaluate the vascular anatomy. Carotid stenosis
measurements (when applicable) are obtained utilizing NASCET
criteria, using the distal internal carotid diameter as the
denominator.
CONTRAST:  75mL OMNIPAQUE IOHEXOL 350 MG/ML SOLN

[Series 6: cta neck · axial · 0.43mm/px · z∈[+8,+134]mm · 2 of 190 slices shown]
[im 64/190  soft-tissue]
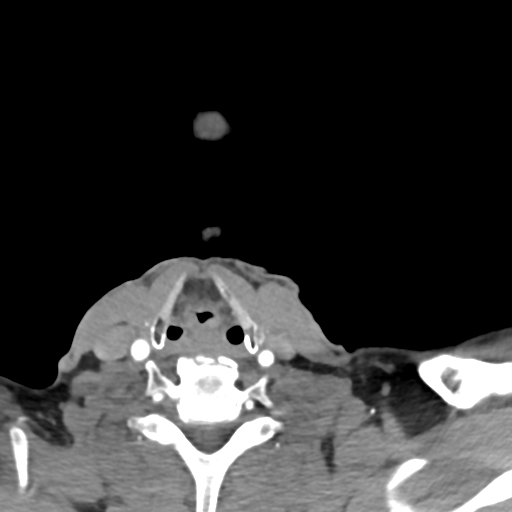
[im 127/190  soft-tissue]
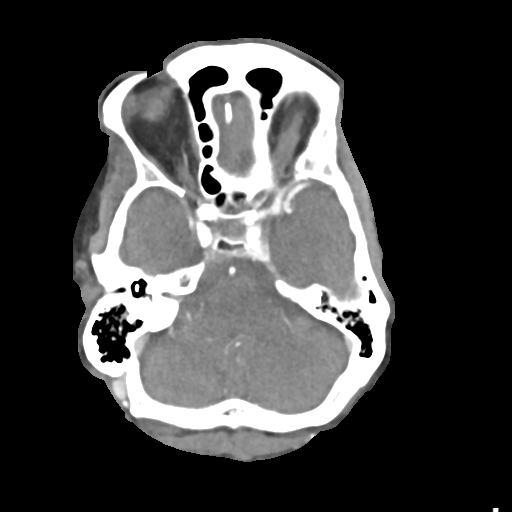

[Series 8: cta neck axial · axial · 0.39mm/px · z∈[-55,+213]mm · 6 of 377 slices shown]
[im 54/377  soft-tissue]
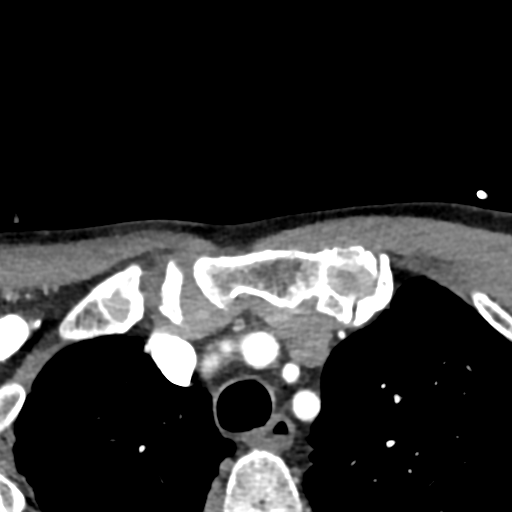
[im 108/377  bone]
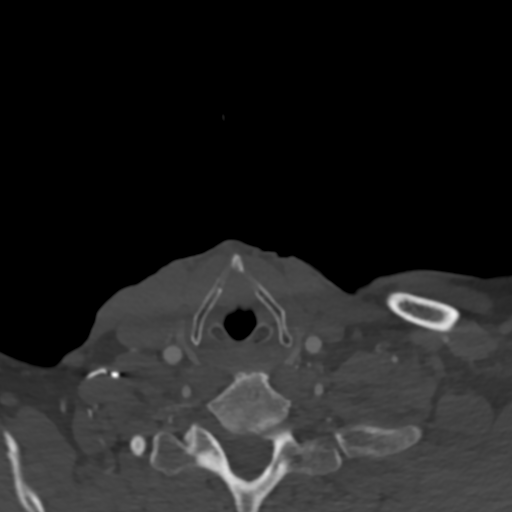
[im 162/377  soft-tissue]
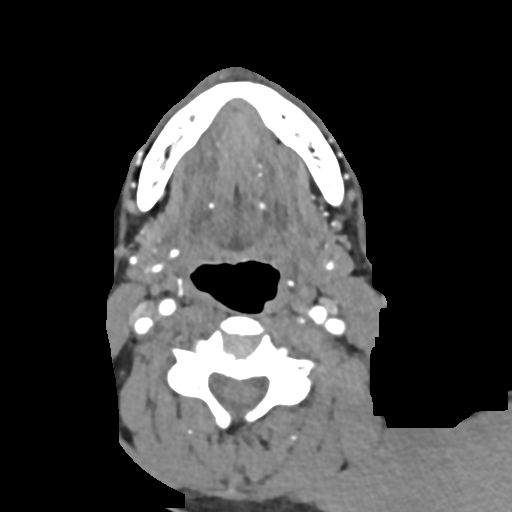
[im 215/377  bone]
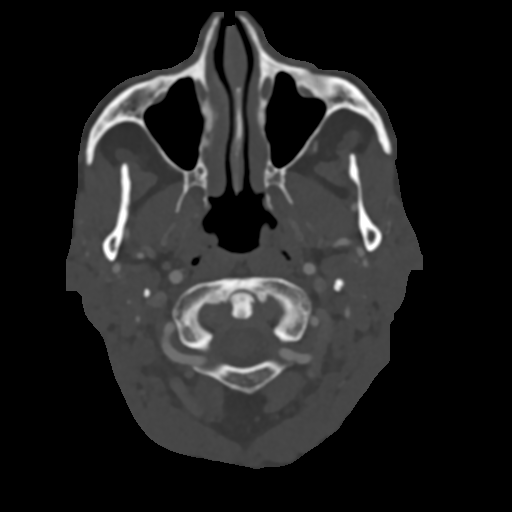
[im 269/377  soft-tissue]
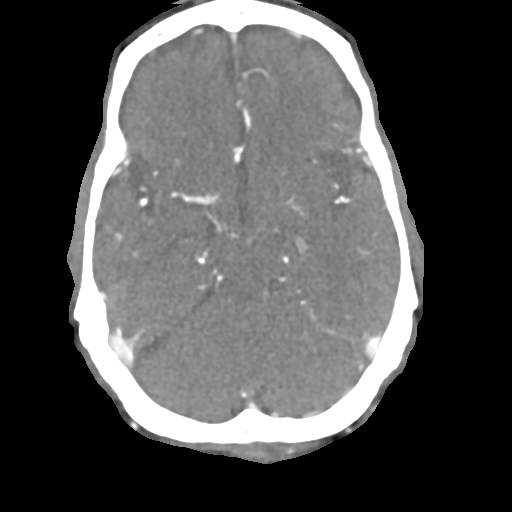
[im 323/377  bone]
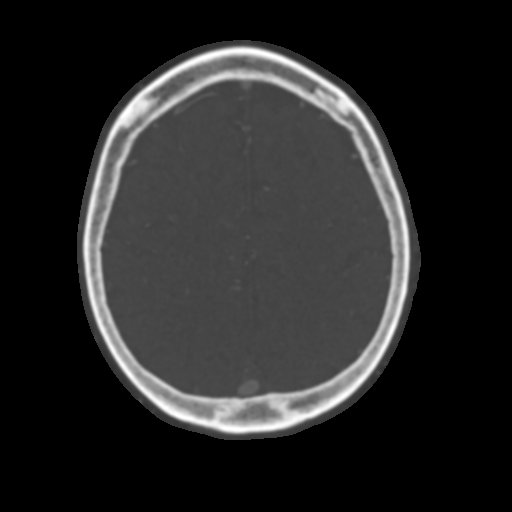

[Series 10: cta neck sagittal · sagittal · 0.57mm/px · 2 of 201 slices shown]
[im 16/201  soft-tissue]
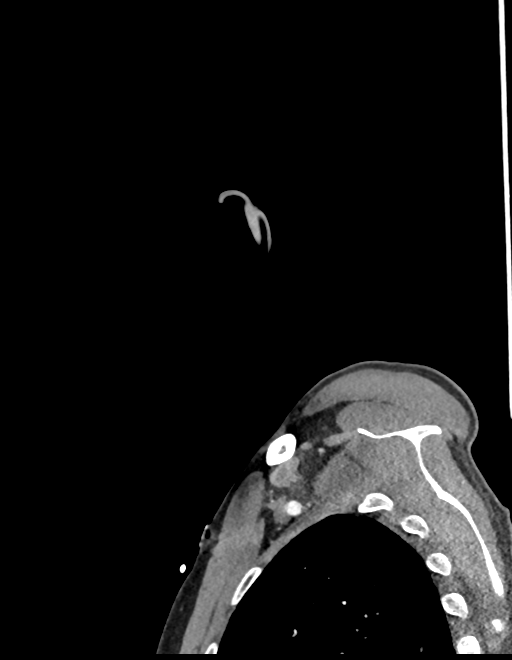
[im 186/201  soft-tissue]
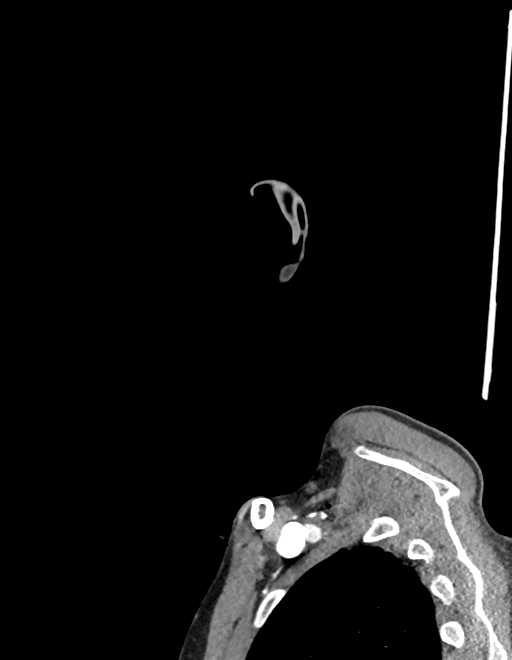

[10 of 36 positions shown; findings below may reference images not displayed]

FINDINGS: CT HEAD FINDINGS

Brain:

Mild generalized cerebral atrophy.

There is no acute intracranial hemorrhage.

No demarcated cortical infarct.

No extra-axial fluid collection.

No evidence of intracranial mass.

No midline shift.

Vascular: Reported below.

Skull: Normal. Negative for fracture or focal lesion.

Sinuses: No significant paranasal sinus disease or mastoid effusion.

Orbits: No acute finding.

Review of the MIP images confirms the above findings

CTA NECK FINDINGS

Aortic arch: Standard aortic branching. Atherosclerotic plaque
within the visualized aortic arch and proximal major branch vessels
of the neck. No hemodynamically significant innominate or proximal
subclavian artery stenosis.

Right carotid system: CCA and ICA patent within the neck without
significant stenosis (50% or greater). Mild soft plaque within the
carotid bulb.

Left carotid system: CCA and ICA patent within the neck without
significant stenosis (50% or greater). Mild soft and calcified
plaque with carotid bulb.

Vertebral arteries: Codominant and patent within the neck without
stenosis.

Skeleton: No acute bony abnormality or aggressive osseous lesion.
Cervical spondylosis greatest at C5-C6. Bilateral neural foraminal
narrowing at this level. No high-grade bony spinal canal stenosis.

Other neck: No neck mass or cervical lymphadenopathy.

Upper chest: No consolidation within the imaged lung apices.
Paraseptal emphysema.

Review of the MIP images confirms the above findings

CTA HEAD FINDINGS

Anterior circulation:

The intracranial internal carotid arteries are patent. Calcified
plaque within both vessels without significant stenosis. The M1
middle cerebral arteries are patent without significant stenosis. No
M2 proximal branch occlusion or high-grade proximal stenosis is
identified. The anterior cerebral arteries are patent. No
intracranial aneurysm is identified.

Posterior circulation:

The intracranial vertebral arteries are patent. The basilar artery
is patent. The posterior cerebral arteries are patent. A right
posterior communicating artery is present. The left posterior
communicating artery is hypoplastic or absent.

Venous sinuses: Within limitations of contrast timing, no convincing
thrombus.

Anatomic variants: As described

Review of the MIP images confirms the above findings
IMPRESSION: CT head:

1. No evidence of acute intracranial abnormality.
2. Mild generalized cerebral atrophy.

CTA neck:

The common carotid, internal carotid and vertebral arteries are
patent within the neck without hemodynamically significant stenosis.
Mild atherosclerotic plaque within the carotid bulbs bilaterally.

CT head:

1. No intracranial large vessel occlusion or proximal high-grade
arterial stenosis.
2. Nonstenotic calcified plaque within both intracranial ICAs.

## 2021-03-22 MED FILL — EPCLUSA 400 MG-100 MG TAB: 400-100 | 28 days supply | Qty: 28 | Fill #2

## 2021-03-22 NOTE — TOC Benefit Eligibility Note (Signed)
Patient Advocate Encounter  Prior Authorization for Epclusa 400-100 mg tablets has been approved.    PA# [id number] Effective dates: 03/22/2021 through 06/14/2021      Roland Earl, CPhT Pharmacy Patient Advocate Specialist  Antimicrobial Stewardship Team Direct Number: [phone]  Fax: [phone]

## 2021-03-22 NOTE — TOC Benefit Eligibility Note (Signed)
Patient Advocate Encounter   Received notification from [facility] that prior authorization for brand name Epclusa 400-100 mg tablets is required.   PA submitted on 03/22/2021 Key [id number] Status is pending       Roland Earl, CPhT Pharmacy Patient Advocate Specialist [facility] Antimicrobial Stewardship Team Direct Number: [phone]  Fax: [phone]

## 2021-03-24 ENCOUNTER — Other Ambulatory Visit (HOSPITAL_COMMUNITY): Payer: Self-pay

## 2021-03-28 ENCOUNTER — Other Ambulatory Visit (HOSPITAL_COMMUNITY): Payer: Self-pay

## 2021-04-06 ENCOUNTER — Other Ambulatory Visit (HOSPITAL_COMMUNITY): Payer: Self-pay

## 2021-04-19 ENCOUNTER — Ambulatory Visit (INDEPENDENT_AMBULATORY_CARE_PROVIDER_SITE_OTHER): Payer: Medicare (Managed Care) | Admitting: Infectious Diseases

## 2021-04-19 ENCOUNTER — Other Ambulatory Visit (HOSPITAL_COMMUNITY): Payer: Self-pay

## 2021-04-19 DIAGNOSIS — B182 Chronic viral hepatitis C: Secondary | ICD-10-CM

## 2021-04-19 DIAGNOSIS — Z5181 Encounter for therapeutic drug level monitoring: Secondary | ICD-10-CM

## 2021-04-26 ENCOUNTER — Ambulatory Visit (INDEPENDENT_AMBULATORY_CARE_PROVIDER_SITE_OTHER): Payer: Medicare (Managed Care) | Admitting: Infectious Diseases

## 2021-04-26 ENCOUNTER — Other Ambulatory Visit: Payer: Self-pay

## 2021-04-26 ENCOUNTER — Encounter: Payer: Self-pay | Admitting: Infectious Diseases

## 2021-04-26 VITALS — BP 117/81 | HR 77 | Temp 98.1°F | Ht 68.0 in | Wt 172.0 lb

## 2021-04-26 DIAGNOSIS — B182 Chronic viral hepatitis C: Secondary | ICD-10-CM

## 2021-04-26 NOTE — Progress Notes (Signed)
[facility]                                                             [address], [place], [place], [address]                                                                  Phn. [phone]; Fax: [phone]                                                                             Date: 04/26/21  Reason for Follow Up: Hepatitis C treatment   Assessment Chronic Hepatitis C  [place] with no evidence of cirrhosis, labs and exam not suggestive of cirrhosis either. However, Fibrotest has f3 score, FIB4 is 1.87  H/o Hepatitis B ( Positive Hep B surface antibody and Hep B core antibody) - Resolved. Has borderline positive Hep B surface antibody  Health Maintenance - Hep A immune  Plan Will finish 2 more days of Epclusa and get a HCV VL/Fibrotest  following that in next 1 week.  Fu in 3-4 months for SVR   All questions and concerns were discussed and addressed. Patient verbalized understanding of the plan. ____________________________________________________________________________________________________________________ Subjective 02/28/21 Here for follow up of hepatitis C treatment. Started taking Epclusa approx 5 weeks ago 01/26/21. Patient is accompanied by wife who states that he has not missed any medications. Denies any new medicines. Denies any concerning side effects. Per wife, still has issues with memory. They got second shipment of Epclusa last Monday. No complaints today   04/26/21 Here for fu for Hepatitis C. Has been taking Epclusa as instructed without missing any doses. Wife says  2 more pills left. Denies any issues or concerns with Epclusa. No new medications. Received  Pfizer booster was on 03/01/21. Still has issues with memory per wife. Denies nausea, vomiting, abdominal pain and diarrhea. No other concerns.   ROS: 12 point ROS done with pertinent positives and negatives listed  above  Past Medical History:  Diagnosis Date  . Essential hypertension 10/13/2020  . IV drug abuse, History of    Current Outpatient Medications on File Prior to Visit  Medication Sig Dispense Refill  . Cyanocobalamin (B-12) 5000 MCG CAPS Please take 1 capsule daily. 30 capsule 0  . Sofosbuvir-Velpatasvir 400-100 MG TABS TAKE 1 TABLET BY MOUTH DAILY. 28 tablet 2   No current facility-administered medications on file prior to visit.   No Known Allergies  Social History   Socioeconomic History  . Marital status: Legally Separated    Spouse name: Not on file  . Number of children: Not on file  . Years of education: Not on file  . Highest education level: Not on file  Occupational History  . Not on  file  Tobacco Use  . Smoking status: Heavy Tobacco Smoker    Packs/day: 0.50    Types: Cigarettes  . Smokeless tobacco: Never Used  Substance and Sexual Activity  . Alcohol use: Never  . Drug use: Never  . Sexual activity: Not on file  Other Topics Concern  . Not on file  Social History Narrative   ** Merged History Encounter **       Social Determinants of Health   Financial Resource Strain: Not on file  Food Insecurity: Not on file  Transportation Needs: Not on file  Physical Activity: Not on file  Stress: Not on file  Social Connections: Not on file  Intimate Partner Violence: Not on file     Vitals BP 117/81   Pulse 77   Temp 98.1 F (36.7 C) (Other (Comment))   Ht 5\' 8"  (1.727 m)   Wt 172 lb (78 kg)   SpO2 99%   BMI 26.15 kg/m    Examination  General - not in acute distress, comfortably sitting in chair HEENT - PEERLA, no pallor and no icterus Chest - b/l clear air entry, no additional sounds CVS- Normal s1s2, RRR Abdomen - Soft, Non tender , non distended Ext- no pedal edema Neuro: grossly normal Back - WNL Psych : calm and cooperative   Recent labs CBC Latest Ref Rng & Units 10/15/2020 10/14/2020 10/13/2020  WBC 4.0 - 10.5 K/uL 5.3 4.8 5.7   Hemoglobin 13.0 - 17.0 g/dL 12.6(L) 12.1(L) 12.2(L)  Hematocrit 39.0 - 52.0 % 38.9(L) 37.2(L) 38.6(L)  Platelets 150 - 400 K/uL 180 179 181   CMP Latest Ref Rng & Units 11/28/2020 10/15/2020 10/14/2020  Glucose 70 - 99 mg/dL - 10/16/2020) 194(R)  BUN 8 - 23 mg/dL - 11 10  Creatinine 740(C - 1.24 mg/dL - 1.44 8.18  Sodium 5.63 - 145 mmol/L - 138 137  Potassium 3.5 - 5.1 mmol/L - 3.5 3.6  Chloride 98 - 111 mmol/L - 105 104  CO2 22 - 32 mmol/L - 25 26  Calcium 8.9 - 10.3 mg/dL - 8.9 149)  Total Protein 6.5 - 8.1 g/dL - - -  Total Bilirubin 0.3 - 1.2 mg/dL - - -  Alkaline Phos 38 - 126 U/L - - -  AST 15 - 41 U/L - - -  ALT 9 - 46 U/L 40 - -     Pertinent Microbiology Results for orders placed or performed during the hospital encounter of 10/12/20  Respiratory Panel by RT PCR (Flu A&B, Covid) - Nasopharyngeal Swab     Status: None   Collection Time: 10/12/20  6:59 PM   Specimen: Nasopharyngeal Swab  Result Value Ref Range Status   SARS Coronavirus 2 by RT PCR NEGATIVE NEGATIVE Final    Comment: (NOTE) SARS-CoV-2 target nucleic acids are NOT DETECTED.  The SARS-CoV-2 RNA is generally detectable in upper respiratoy specimens during the acute phase of infection. The lowest concentration of SARS-CoV-2 viral copies this assay can detect is 131 copies/mL. A negative result does not preclude SARS-Cov-2 infection and should not be used as the sole basis for treatment or other patient management decisions. A negative result may occur with  improper specimen collection/handling, submission of specimen other than nasopharyngeal swab, presence of viral mutation(s) within the areas targeted by this assay, and inadequate number of viral copies (<131 copies/mL). A negative result must be combined with clinical observations, patient history, and epidemiological information. The expected result is Negative.  Fact Sheet for Patients:  https://www.moore.com/  Fact Sheet for  Healthcare Providers:  https://www.young.biz/  This test is no t yet approved or cleared by the [place] FDA and  has been authorized for detection and/or diagnosis of SARS-CoV-2 by FDA under an Emergency Use Authorization (EUA). This EUA will remain  in effect (meaning this test can be used) for the duration of the COVID-19 declaration under Section 564(b)(1) of the Act, 21 U.S.C. section 360bbb-3(b)(1), unless the authorization is terminated or revoked sooner.     Influenza A by PCR NEGATIVE NEGATIVE Final   Influenza B by PCR NEGATIVE NEGATIVE Final    Comment: (NOTE) The Xpert Xpress SARS-CoV-2/FLU/RSV assay is intended as an aid in  the diagnosis of influenza from Nasopharyngeal swab specimens and  should not be used as a sole basis for treatment. Nasal washings and  aspirates are unacceptable for Xpert Xpress SARS-CoV-2/FLU/RSV  testing.  Fact Sheet for Patients: https://www.moore.com/  Fact Sheet for Healthcare Providers: https://www.young.biz/  This test is not yet approved or cleared by the [place] FDA and  has been authorized for detection and/or diagnosis of SARS-CoV-2 by  FDA under an Emergency Use Authorization (EUA). This EUA will remain  in effect (meaning this test can be used) for the duration of the  Covid-19 declaration under Section 564(b)(1) of the Act, 21  U.S.C. section 360bbb-3(b)(1), unless the authorization is  terminated or revoked. Performed at [facility], [address] [place], [place] [address]       Pertinent Imaging FINDINGS: ULTRASOUND ABDOMEN  Gallbladder: No gallstones or wall thickening visualized. No sonographic Murphy sign noted by sonographer.  Common bile duct: Diameter: 5 mm  Liver: Subtle increased echogenicity of hepatic parenchyma. Lobulated contours of the liver without frank nodularity or visible lesion. Portal vein is patent on color  Doppler imaging with normal direction of blood flow towards the liver.  IVC: Limited assessment of the IVC mainly on transverse images, characteristic longitudinal image limited by overlying bowel gas. Areas that are well seen are unremarkable.  Pancreas: Visualized portion unremarkable.  Spleen: Size and appearance within normal limits.  Right Kidney: Length: 10.5 cm. Echogenicity within normal limits. No mass or hydronephrosis visualized.  Left Kidney: Length: 10.7 cm. Focal scarring in the interpolar LEFT kidney, no hydronephrosis or visible lesion.  Abdominal aorta: No aneurysm visualized. Limited assessment proximally due to overlying bowel gas.  Other findings: None.  ULTRASOUND HEPATIC ELASTOGRAPHY  Device: Siemens Helix VTQ  Patient position: Supine  Transducer C5-1  Number of measurements: 10  Hepatic segment:  VIII  Median kPa: 4.3  IQR: 0.6  IQR/Median kPa ratio: 0.1  Data quality:  Good  Diagnostic category:  < or = 5 kPa: high probability of being normal  The use of hepatic elastography is applicable to patients with viral hepatitis and non-alcoholic fatty liver disease. At this time, there is insufficient data for the referenced cut-off values and use in other causes of liver disease, including alcoholic liver disease. Patients, however, may be assessed by elastography and serve as their own reference standard/baseline.  In patients with non-alcoholic liver disease, the values suggesting compensated advanced chronic liver disease (cACLD) may be lower, and patients may need additional testing with elasticity results of 7-9 kPa.  Please note that abnormal hepatic elasticity and shear wave velocities may also be identified in clinical settings other than with hepatic fibrosis, such as: acute hepatitis, elevated right heart and central venous pressures including use of beta blockers, veno-occlusive disease (Budd-Chiari),  infiltrative  processes such as mastocytosis/amyloidosis/infiltrative tumor/lymphoma, extrahepatic cholestasis, with hyperemia in the post-prandial state, and with liver transplantation. Correlation with patient history, laboratory data, and clinical condition recommended.  Diagnostic Categories:  < or =5 kPa: high probability of being normal  < or =9 kPa: in the absence of other known clinical signs, rules out cACLD  >9 kPa and ?13 kPa: suggestive of cACLD, but needs further testing  >13 kPa: highly suggestive of cACLD  > or =17 kPa: highly suggestive of cACLD with an increased probability of clinically significant portal hypertension  IMPRESSION: ULTRASOUND ABDOMEN:  Mild hepatic steatosis with mildly lobular hepatic contours, no frank signs of cirrhosis with normal size spleen.  Mildly limited assessment due to overlying bowel gas with respect to mid abdominal structures as described.  ULTRASOUND HEPATIC ELASTOGRAPHY:  Median kPa:  4.3  Diagnostic category:  < or = 5 kPa: high probability of being normal   All pertinent labs/Imagings/notes reviewed. All pertinent plain films and CT images have been personally visualized and interpreted; radiology reports have been reviewed. Decision making incorporated into the Impression / Recommendations.  I spent 30 minutes with the patient including  review of prior medical records with greater than 50% of time in face to face counsel of the patient.    Electronically signed by:  Odette FractionSabina Essynce Munsch, MD Infectious Disease Physician [facility]  [facility] [address] [place], [place] [address] Phone: [phone]  Fax: [phone]

## 2021-05-04 ENCOUNTER — Other Ambulatory Visit: Payer: Self-pay

## 2021-05-04 ENCOUNTER — Other Ambulatory Visit: Payer: Medicare (Managed Care)

## 2021-05-04 DIAGNOSIS — B182 Chronic viral hepatitis C: Secondary | ICD-10-CM

## 2021-05-09 LAB — LIVER FIBROSIS, FIBROTEST-ACTITEST
ALT: 8 U/L — ABNORMAL LOW (ref 9–46)
Alpha-2-Macroglobulin: 420 mg/dL — ABNORMAL HIGH (ref 106–279)
Apolipoprotein A1: 143 mg/dL (ref 94–176)
Bilirubin: 0.6 mg/dL (ref 0.2–1.2)
Fibrosis Score: 0.65
GGT: 16 U/L (ref 3–70)
Haptoglobin: 111 mg/dL (ref 43–212)
Necroinflammat ACT Score: 0.03
Reference ID: 3866932

## 2021-05-09 LAB — HEPATITIS C RNA QUANTITATIVE
HCV Quantitative Log: <1.18 log IU/mL
HCV RNA, PCR, QN: <15 IU/mL

## 2021-06-02 IMAGING — US US ABDOMEN COMPLETE W/ ELASTOGRAPHY
2 series · 12 of 25 positions shown · non-contrast
Comparison: None.

CLINICAL DATA: History of hepatitis C in this 64-year-old male.

EXAM:
ULTRASOUND ABDOMEN
ULTRASOUND HEPATIC ELASTOGRAPHY
TECHNIQUE: Sonography of the upper abdomen was performed. In addition,
ultrasound elastography evaluation of the liver was performed. A
region of interest was placed within the right lobe of the liver.
Following application of a compressive sonographic pulse, tissue
compressibility was assessed. Multiple assessments were performed at
the selected site. Median tissue compressibility was determined.
Previously, hepatic stiffness was assessed by shear wave velocity.
Based on recently published Society of Radiologists in Ultrasound
consensus article, reporting is now recommended to be performed in
the SI units of pressure (kiloPascals) representing hepatic
stiffness/elasticity. The obtained result is compared to the
published reference standards. (cACLD = compensated Advanced Chronic
Liver Disease)

[Series 1: us abdomen complete w/elastography · 9 of 93 slices shown]
[im 6/93]
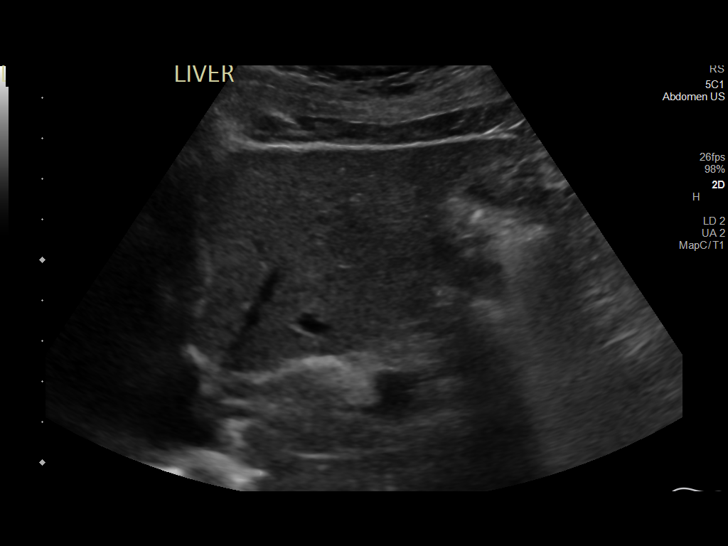
[im 17/93]
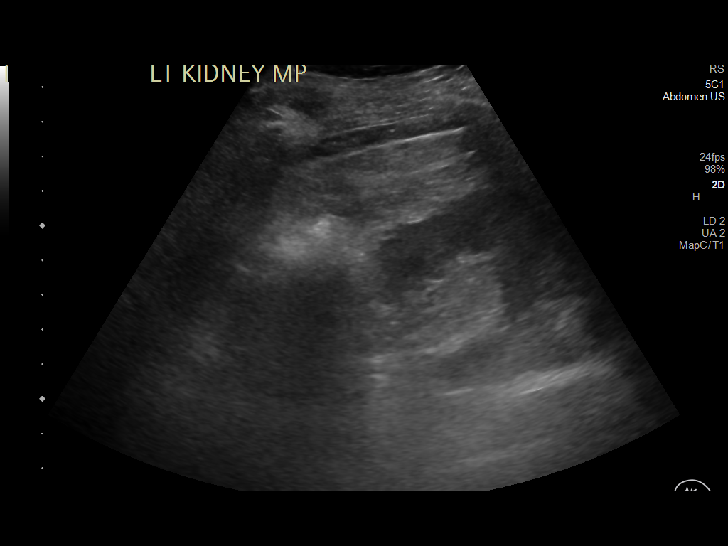
[im 28/93]
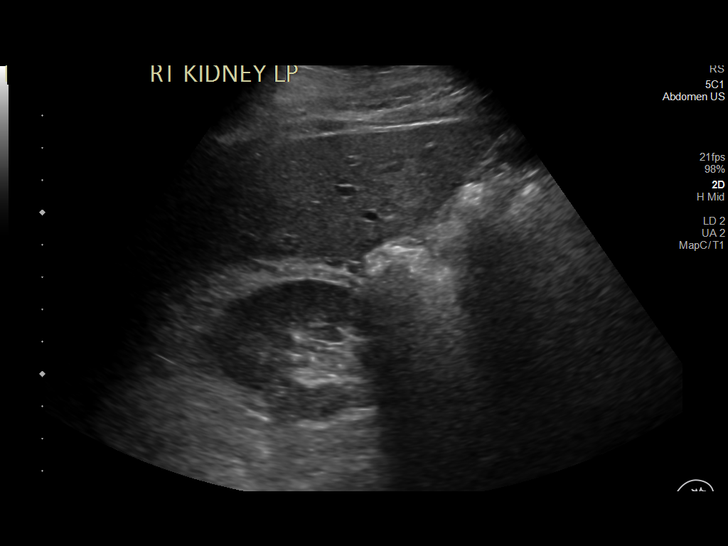
[im 38/93]
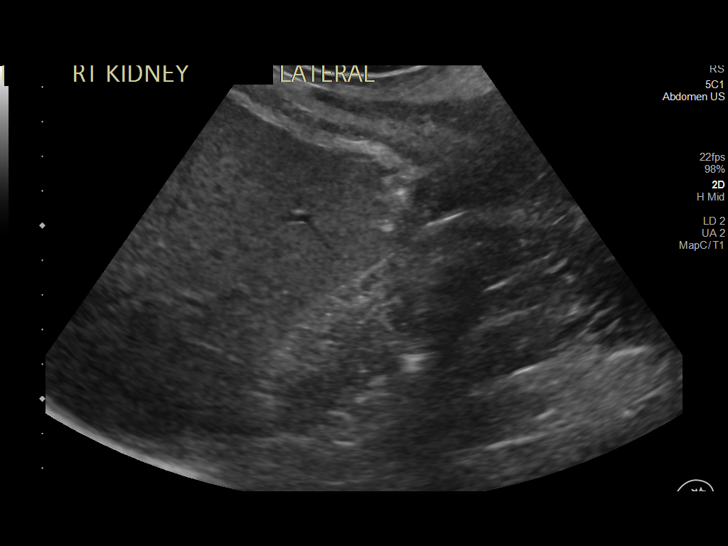
[im 49/93]
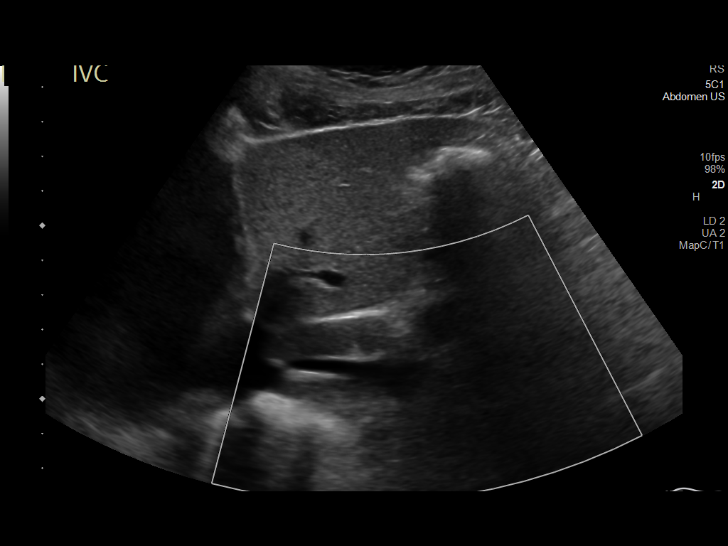
[im 60/93]
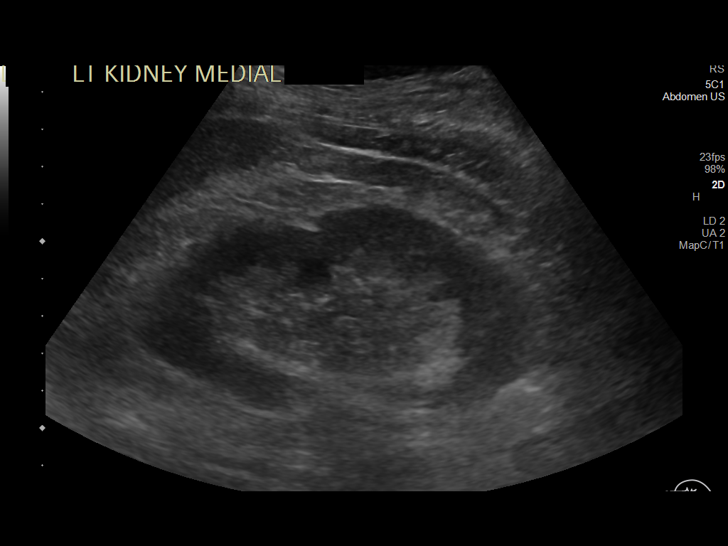
[im 71/93]
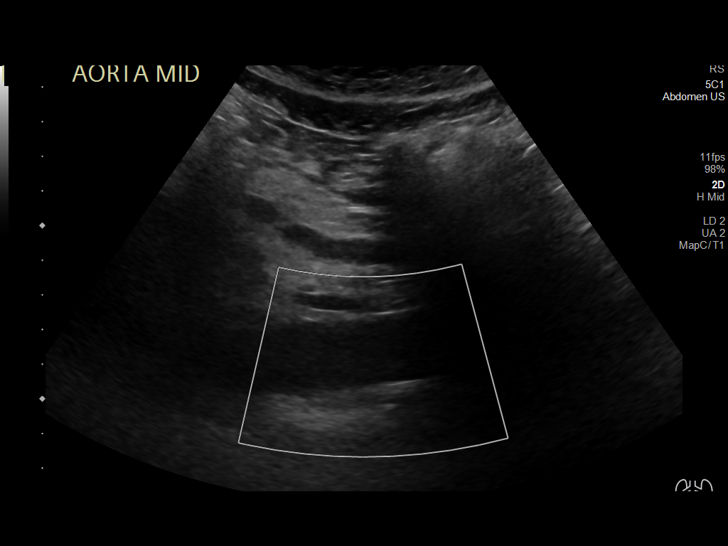
[im 82/93]
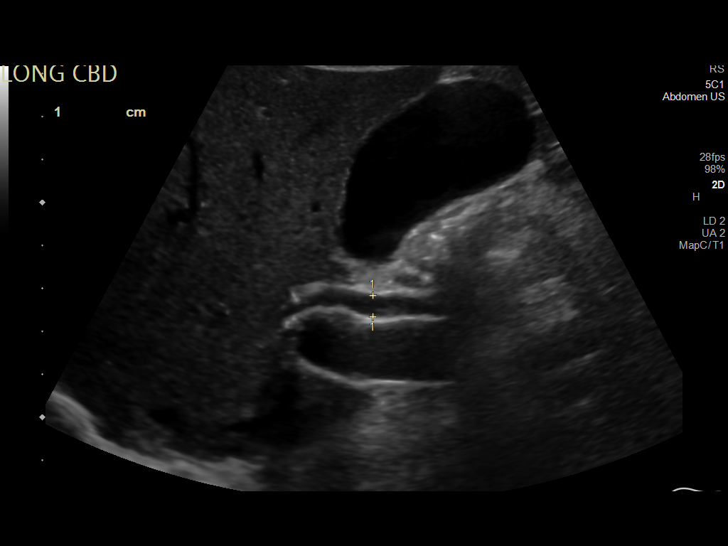
[im 93/93]
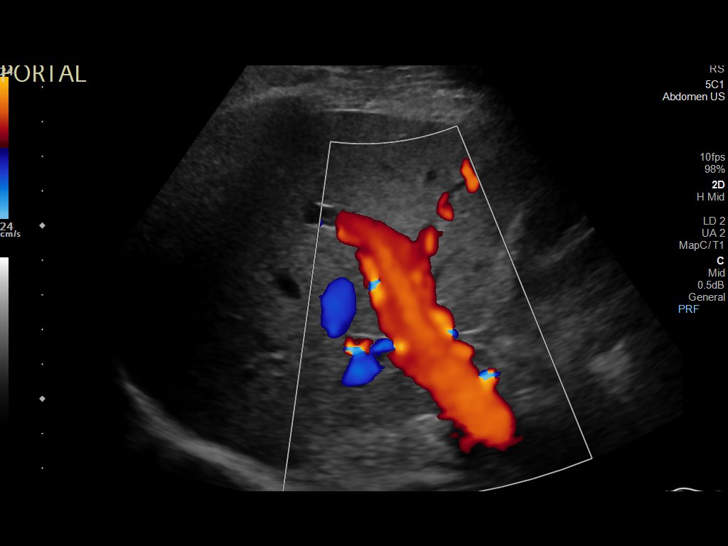

[Series 1001: abdomen us · 3 of 37 slices shown]
[im 7/37]
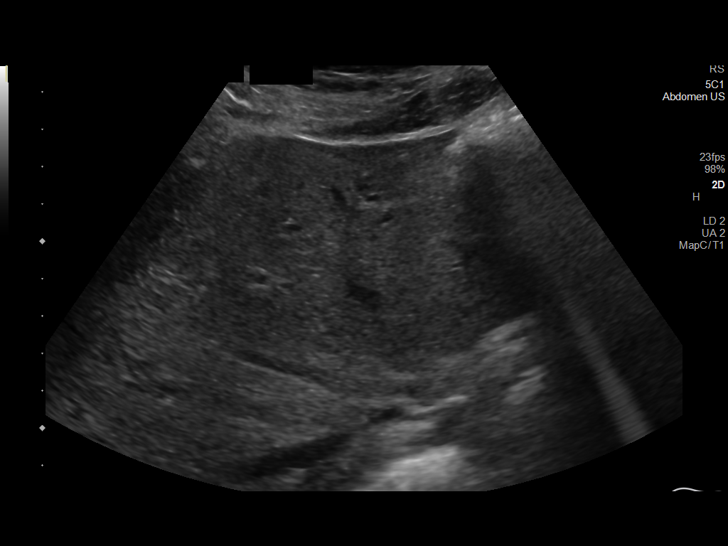
[im 19/37]
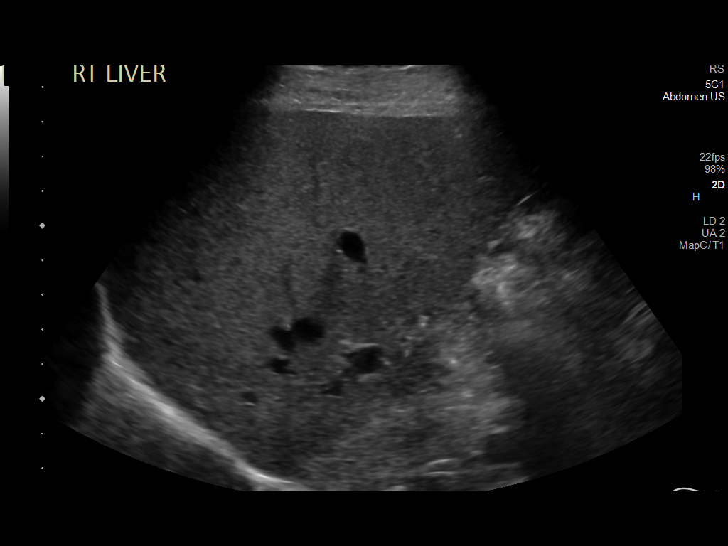
[im 31/37]
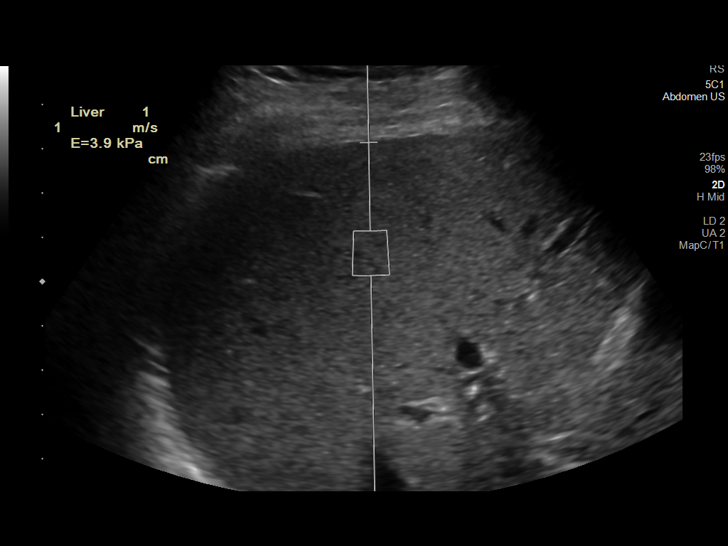

[12 of 25 positions shown; findings below may reference images not displayed]

FINDINGS: ULTRASOUND ABDOMEN

Gallbladder: No gallstones or wall thickening visualized. No
sonographic Murphy sign noted by sonographer.

Common bile duct: Diameter: 5 mm

Liver: Subtle increased echogenicity of hepatic parenchyma.
Lobulated contours of the liver without frank nodularity or visible
lesion. Portal vein is patent on color Doppler imaging with normal
direction of blood flow towards the liver.

IVC: Limited assessment of the IVC mainly on transverse images,
characteristic longitudinal image limited by overlying bowel gas.
Areas that are well seen are unremarkable.

Pancreas: Visualized portion unremarkable.

Spleen: Size and appearance within normal limits.

Right Kidney: Length: 10.5 cm. Echogenicity within normal limits. No
mass or hydronephrosis visualized.

Left Kidney: Length: 10.7 cm. Focal scarring in the interpolar LEFT
kidney, no hydronephrosis or visible lesion.

Abdominal aorta: No aneurysm visualized. Limited assessment
proximally due to overlying bowel gas.

Other findings: None.

ULTRASOUND HEPATIC ELASTOGRAPHY

Device: Siemens Helix VTQ

Patient position: Supine

Transducer C5-1

Number of measurements: 10

Hepatic segment:  VIII

Median kPa:

IQR:

IQR/Median kPa ratio:

Data quality:  Good

Diagnostic category:  < or = 5 kPa: high probability of being normal

The use of hepatic elastography is applicable to patients with viral
hepatitis and non-alcoholic fatty liver disease. At this time, there
is insufficient data for the referenced cut-off values and use in
other causes of liver disease, including alcoholic liver disease.
Patients, however, may be assessed by elastography and serve as
their own reference standard/baseline.

In patients with non-alcoholic liver disease, the values suggesting
compensated advanced chronic liver disease (cACLD) may be lower, and
patients may need additional testing with elasticity results of [DATE]
kPa.

Please note that abnormal hepatic elasticity and shear wave
velocities may also be identified in clinical settings other than
with hepatic fibrosis, such as: acute hepatitis, elevated right
heart and central venous pressures including use of beta blockers,
Glory disease (Ybertha), infiltrative processes such as
mastocytosis/amyloidosis/infiltrative tumor/lymphoma, extrahepatic
cholestasis, with hyperemia in the post-prandial state, and with
liver transplantation. Correlation with patient history, laboratory
data, and clinical condition recommended.

Diagnostic Categories:

< or =5 kPa: high probability of being normal

< or =9 kPa: in the absence of other known clinical signs, rules [DATE] kPa and ?13 kPa: suggestive of cACLD, but needs further testing

>13 kPa: highly suggestive of cACLD

> or =17 kPa: highly suggestive of cACLD with an increased
probability of clinically significant portal hypertension
IMPRESSION: ULTRASOUND ABDOMEN:

Mild hepatic steatosis with mildly lobular hepatic contours, no
frank signs of cirrhosis with normal size spleen.

Mildly limited assessment due to overlying bowel gas with respect to
mid abdominal structures as described.

ULTRASOUND HEPATIC ELASTOGRAPHY:

Median kPa:

Diagnostic category:  < or = 5 kPa: high probability of being normal

## 2021-06-29 ENCOUNTER — Telehealth: Payer: Self-pay

## 2021-06-29 NOTE — Telephone Encounter (Signed)
Patient's wife called stating the patient has a fungal infection on his toes on the right foot. She would like to know if the is due to the Epclusa he was taking for Hep, which he has now completed. Per Cassie the Epclusa would not have caused a fungal infection on the toes. Patient wife has been informed and will have the patient follow up with his pcp. Perris Conwell T Pricilla Loveless

## 2021-07-05 ENCOUNTER — Other Ambulatory Visit: Payer: Self-pay

## 2021-07-05 ENCOUNTER — Ambulatory Visit (INDEPENDENT_AMBULATORY_CARE_PROVIDER_SITE_OTHER): Payer: Medicare (Managed Care) | Admitting: Family Medicine

## 2021-07-05 VITALS — BP 155/103 | HR 67 | Ht 67.0 in | Wt 170.6 lb

## 2021-07-05 DIAGNOSIS — B351 Tinea unguium: Secondary | ICD-10-CM | POA: Diagnosis not present

## 2021-07-05 DIAGNOSIS — R030 Elevated blood-pressure reading, without diagnosis of hypertension: Secondary | ICD-10-CM

## 2021-07-05 NOTE — Patient Instructions (Addendum)
Thank you for coming to see me today. It was a pleasure.   We will get some labs today.  If they are abnormal or we need to do something about them, I will call you.  If they are normal, I will send you a message on MyChart (if it is active) or a letter in the mail.  If you don't hear from [place] in 2 weeks, please call the office at the number below.   I will call you to talk about treatment when labs are resulted  Monitor your blood pressure two to three times a week and record. You can review this at your next visit with your PCP  Please follow-up with PCP in 1-2 weeks to discuss blood pressure  If you have any questions or concerns, please do not hesitate to call the office at [phone].  Best,   Dana Allan, MD

## 2021-07-05 NOTE — Progress Notes (Signed)
    SUBJECTIVE:   CHIEF COMPLAINT / HPI: toenail infection  Toenails thickened and painful. Has been ongoing for at least 2-3 years.  Never been treated for fungal infections. Denies any itching, foul odor or discharge.  Recently completed Hep C antiviral treatment.  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  Painful to touch.  Pulses present and well perfused.   ASSESSMENT/PLAN:   Onychomycosis Fluorescent yellow/green discoloration of all toenails using woods lamp.  CMP today wnl. Discussed with patient and wife results and wishes to continue with treatment. -Terbinafine 250 mg daily x 14 days. Will need 12 weeks in total. May need KOH testing at that time. -Follow up with Dr in 2 weeks to evaluate effectiveness of treatment.   -Will need repeat LFT's at 6 weeks treatment   Elevated blood pressure reading BP elevated today. Reports was on medications in the past.  Repeat BP improved slightly, currently asymptomatic -Strict return precautions provided -Follow up with PCP for continued management     Nobie Putnam, MD [facility]

## 2021-07-06 LAB — COMPREHENSIVE METABOLIC PANEL
ALT: 11 IU/L (ref 0–44)
AST: 17 IU/L (ref 0–40)
Albumin/Globulin Ratio: 1.6 (ref 1.2–2.2)
Albumin: 4.5 g/dL (ref 3.8–4.8)
Alkaline Phosphatase: 83 IU/L (ref 44–121)
BUN/Creatinine Ratio: 14 (ref 10–24)
BUN: 14 mg/dL (ref 8–27)
Bilirubin Total: 0.4 mg/dL (ref 0.0–1.2)
CO2: 24 mmol/L (ref 20–29)
Calcium: 9.1 mg/dL (ref 8.6–10.2)
Chloride: 104 mmol/L (ref 96–106)
Creatinine, Ser: 1 mg/dL (ref 0.76–1.27)
Globulin, Total: 2.9 g/dL (ref 1.5–4.5)
Glucose: 93 mg/dL (ref 65–99)
Potassium: 4.6 mmol/L (ref 3.5–5.2)
Sodium: 142 mmol/L (ref 134–144)
Total Protein: 7.4 g/dL (ref 6.0–8.5)
eGFR: 84 mL/min/{1.73_m2} (ref >59)

## 2021-07-09 ENCOUNTER — Encounter: Payer: Self-pay | Admitting: Family Medicine

## 2021-07-09 DIAGNOSIS — R030 Elevated blood-pressure reading, without diagnosis of hypertension: Secondary | ICD-10-CM | POA: Insufficient documentation

## 2021-07-09 DIAGNOSIS — B351 Tinea unguium: Secondary | ICD-10-CM | POA: Insufficient documentation

## 2021-07-09 MED ORDER — TERBINAFINE HCL 250 MG PO TABS: 250.0000 mg | ORAL_TABLET | Freq: Every day | ORAL | 0 refills | Status: AC

## 2021-07-09 NOTE — Assessment & Plan Note (Signed)
Fluorescent yellow/green discoloration of all toenails using woods lamp.  CMP today wnl. Discussed with patient and wife results and wishes to continue with treatment. -Terbinafine 250 mg daily x 14 days. Will need 12 weeks in total. May need KOH testing at that time. -Follow up with Dr Nobie Putnam in 2 weeks to evaluate effectiveness of treatment.   -Will need repeat LFT's at 6 weeks treatment

## 2021-07-09 NOTE — Assessment & Plan Note (Signed)
BP elevated today. Reports was on medications in the past.  Repeat BP improved slightly, currently asymptomatic -Strict return precautions provided -Follow up with PCP for continued management

## 2021-07-26 ENCOUNTER — Ambulatory Visit: Payer: Medicare (Managed Care) | Admitting: Family Medicine

## 2021-08-10 ENCOUNTER — Telehealth (INDEPENDENT_AMBULATORY_CARE_PROVIDER_SITE_OTHER): Payer: Medicare (Managed Care) | Admitting: Family Medicine

## 2021-08-10 DIAGNOSIS — B351 Tinea unguium: Secondary | ICD-10-CM

## 2021-08-10 MED ORDER — TERBINAFINE HCL 250 MG PO TABS: 250.0000 mg | ORAL_TABLET | Freq: Every day | ORAL | 0 refills | Status: DC

## 2021-08-10 NOTE — Assessment & Plan Note (Signed)
Patient previously evaluated and positive for onychomycosis of his toes.  He had a CMP on the initial evaluation which had LFTs within normal limits.  Patient's caregiver reports that he took 2 weeks of the medication but she understands any needs to be taking it for 12 weeks.  It has been approximately 3 weeks since his last treatment dose.  Sent prescription for 4 additional weeks of treatment and schedule the patient for an appointment with me in 4 weeks to evaluate improvement of the toes as well as check LFTs.  Patient may need KOH testing at that time.  Total duration of treatment should be 12 weeks.  Strict return precautions given.

## 2021-08-10 NOTE — Progress Notes (Signed)
[facility] Telemedicine Visit  Patient consented to have virtual visit and was identified by name and date of birth. Method of visit: Video was attempted but was interrupted: <50% of visit completed via video  Encounter participants: Patient: Chad Daniel - located at home Provider: Derrel Nip - located at home   Chief Complaint: Onychomycosis  HPI:  Spoke with patient's caregiver, patient was in the background.  Attempted video visit but patient's phone kept saying the video technology was not supported by their web browser so we completed the telephone visit.  Patient's caregiver reports that the patient took 2 weeks of the medication and then missed the follow-up appointment.  Has not been on treatment since August 1.  She has not noticed any improvement in the patient's toenails.  He keeps them wrapped up in socks and shoes all the time and she is trying to get him to keep his toes out of socks and shoes.  Reports that his toes are still hurting.  Was told that he needed lab work drawn at 6 weeks of treatment and so she was concerned that he may need lab work today.  ROS: per HPI  Pertinent PMHx: Hepatitis C  Exam:  There were no vitals taken for this visit.  Respiratory: Patient is able to speak in full sentences  Assessment/Plan:  Onychomycosis Patient previously evaluated and positive for onychomycosis of his toes.  He had a CMP on the initial evaluation which had LFTs within normal limits.  Patient's caregiver reports that he took 2 weeks of the medication but she understands any needs to be taking it for 12 weeks.  It has been approximately 3 weeks since his last treatment dose.  Sent prescription for 4 additional weeks of treatment and schedule the patient for an appointment with me in 4 weeks to evaluate improvement of the toes as well as check LFTs.  Patient may need KOH testing at that time.  Total duration of treatment should be 12 weeks.  Strict  return precautions given.    Time spent during visit with patient: 19 minutes

## 2021-09-06 ENCOUNTER — Ambulatory Visit (INDEPENDENT_AMBULATORY_CARE_PROVIDER_SITE_OTHER): Payer: Medicare (Managed Care) | Admitting: Infectious Diseases

## 2021-09-06 ENCOUNTER — Other Ambulatory Visit: Payer: Self-pay

## 2021-09-06 ENCOUNTER — Encounter: Payer: Self-pay | Admitting: Infectious Diseases

## 2021-09-06 VITALS — BP 146/98 | HR 55 | Temp 98.2°F | Ht 68.0 in | Wt 167.0 lb

## 2021-09-06 DIAGNOSIS — B182 Chronic viral hepatitis C: Secondary | ICD-10-CM

## 2021-09-06 DIAGNOSIS — Z5181 Encounter for therapeutic drug level monitoring: Secondary | ICD-10-CM | POA: Diagnosis not present

## 2021-09-06 NOTE — Progress Notes (Signed)
[facility]                                                             [address], [place], [place], [address]                                                                  Phn. [phone]; Fax: [phone]                                                                             Date: 09/06/21  Reason for Follow Up: Hepatitis C treatment   Assessment Problem List Items Addressed This Visit       Digestive   Chronic hepatitis C without hepatic coma (HCC) - Primary   Relevant Orders   Hepatitis C RNA quantitative     Other   Medication monitoring encounter    Chronic Hepatitis C  [place] with no evidence of cirrhosis, labs and exam not suggestive of cirrhosis either. However, Fibrotest has f3 score, FIB4 is 1.87. Guidelines unclear in terms of HCC screening for f2-f3.   H/o Hepatitis B - Negative hep B surface ag in 10/12/20. Negative Hep B e ab and e ag, HBV DNA in 01/04/2021  Plan HCV RNA for SVR check  Follow up as needed  Discussed about possibility of re-infection if persistent isk factors  All questions and concerns were discussed and addressed. Patient verbalized understanding of the plan. ____________________________________________________________________________________________________________________ Interval Events 09/06/21 Here for follow up in the setting of Chronic Hepatitis C. Patient is accompanied by his wife. Patient has completed 12 weeks of Epclusa. Wife says she gives him his medications everyday and have watched when he takes the medication. Per wife, he is still forgetful and is following with PCP for that. He is also being treated for fungal infection in his toes with terbinafine. He has an upcoming appointment with PCP this Friday. Discussed with patient wife about getting labs today for clearance of virus.   ROS: 12 point ROS done with pertinent positives  and negatives listed above  Past Medical History:  Diagnosis Date   Essential hypertension 10/13/2020   IV drug abuse, History of    Current Outpatient Medications on File Prior to Visit  Medication Sig Dispense Refill   Cyanocobalamin (B-12) 5000 MCG CAPS Please take 1 capsule daily. 30 capsule 0   terbinafine (LAMISIL) 250 MG tablet Take 1 tablet (250 mg total) by mouth daily. 28 tablet 0   No current facility-administered medications on file prior to visit.   No Known Allergies  Social History   Socioeconomic History   Marital status: Legally Separated    Spouse name: Not on file   Number of children: Not on file   Years of education:  Not on file   Highest education level: Not on file  Occupational History   Not on file  Tobacco Use   Smoking status: Heavy Smoker    Packs/day: 0.50    Types: Cigarettes   Smokeless tobacco: Never  Substance and Sexual Activity   Alcohol use: Never   Drug use: Never   Sexual activity: Not Currently  Other Topics Concern   Not on file  Social History Narrative   ** Merged History Encounter **       Social Determinants of Health   Financial Resource Strain: Not on file  Food Insecurity: Not on file  Transportation Needs: Not on file  Physical Activity: Not on file  Stress: Not on file  Social Connections: Not on file  Intimate Partner Violence: Not on file    Vitals BP (!) 158/107   Pulse (!) 58   Temp 98.2 F (36.8 C) (Oral)   Ht 5\' 8"  (1.727 m)   Wt 167 lb (75.8 kg)   BMI 25.39 kg/m    Examination  General - not in acute distress, comfortably sitting in chair HEENT - PEERLA, no pallor and no icterus Chest - b/l clear air entry, no additional sounds CVS- Normal s1s2, RRR Abdomen - Soft, Non tender , non distended Ext- no pedal edema Neuro: grossly normal Back - WNL Psych : calm and cooperative  Recent labs CBC Latest Ref Rng & Units 10/15/2020 10/14/2020 10/13/2020  WBC 4.0 - 10.5 K/uL 5.3 4.8 5.7   Hemoglobin 13.0 - 17.0 g/dL 12.6(L) 12.1(L) 12.2(L)  Hematocrit 39.0 - 52.0 % 38.9(L) 37.2(L) 38.6(L)  Platelets 150 - 400 K/uL 180 179 181   CMP Latest Ref Rng & Units 07/05/2021 05/04/2021 11/28/2020  Glucose 65 - 99 mg/dL 93 - -  BUN 8 - 27 mg/dL 14 - -  Creatinine 14/05/2020 - 1.27 mg/dL 2.70 - -  Sodium 3.50 - 144 mmol/L 142 - -  Potassium 3.5 - 5.2 mmol/L 4.6 - -  Chloride 96 - 106 mmol/L 104 - -  CO2 20 - 29 mmol/L 24 - -  Calcium 8.6 - 10.2 mg/dL 9.1 - -  Total Protein 6.0 - 8.5 g/dL 7.4 - -  Total Bilirubin 0.0 - 1.2 mg/dL 0.4 - -  Alkaline Phos 44 - 121 IU/L 83 - -  AST 0 - 40 IU/L 17 - -  ALT 0 - 44 IU/L 11 8(L) 40     Pertinent Microbiology Results for orders placed or performed during the hospital encounter of 10/12/20  Respiratory Panel by RT PCR (Flu A&B, Covid) - Nasopharyngeal Swab     Status: None   Collection Time: 10/12/20  6:59 PM   Specimen: Nasopharyngeal Swab  Result Value Ref Range Status   SARS Coronavirus 2 by RT PCR NEGATIVE NEGATIVE Final    Comment: (NOTE) SARS-CoV-2 target nucleic acids are NOT DETECTED.  The SARS-CoV-2 RNA is generally detectable in upper respiratoy specimens during the acute phase of infection. The lowest concentration of SARS-CoV-2 viral copies this assay can detect is 131 copies/mL. A negative result does not preclude SARS-Cov-2 infection and should not be used as the sole basis for treatment or other patient management decisions. A negative result may occur with  improper specimen collection/handling, submission of specimen other than nasopharyngeal swab, presence of viral mutation(s) within the areas targeted by this assay, and inadequate number of viral copies (<131 copies/mL). A negative result must be combined with clinical observations, patient history, and epidemiological information. The  expected result is Negative.  Fact Sheet for Patients:  https://www.moore.com/  Fact Sheet for Healthcare  Providers:  https://www.young.biz/  This test is no t yet approved or cleared by the [place] FDA and  has been authorized for detection and/or diagnosis of SARS-CoV-2 by FDA under an Emergency Use Authorization (EUA). This EUA will remain  in effect (meaning this test can be used) for the duration of the COVID-19 declaration under Section 564(b)(1) of the Act, 21 U.S.C. section 360bbb-3(b)(1), unless the authorization is terminated or revoked sooner.     Influenza A by PCR NEGATIVE NEGATIVE Final   Influenza B by PCR NEGATIVE NEGATIVE Final    Comment: (NOTE) The Xpert Xpress SARS-CoV-2/FLU/RSV assay is intended as an aid in  the diagnosis of influenza from Nasopharyngeal swab specimens and  should not be used as a sole basis for treatment. Nasal washings and  aspirates are unacceptable for Xpert Xpress SARS-CoV-2/FLU/RSV  testing.  Fact Sheet for Patients: https://www.moore.com/  Fact Sheet for Healthcare Providers: https://www.young.biz/  This test is not yet approved or cleared by the [place] FDA and  has been authorized for detection and/or diagnosis of SARS-CoV-2 by  FDA under an Emergency Use Authorization (EUA). This EUA will remain  in effect (meaning this test can be used) for the duration of the  Covid-19 declaration under Section 564(b)(1) of the Act, 21  U.S.C. section 360bbb-3(b)(1), unless the authorization is  terminated or revoked. Performed at [facility], [address], [place], [place] [address]       Pertinent Imaging FINDINGS: ULTRASOUND ABDOMEN   Gallbladder: No gallstones or wall thickening visualized. No sonographic Murphy sign noted by sonographer.   Common bile duct: Diameter: 5 mm   Liver: Subtle increased echogenicity of hepatic parenchyma. Lobulated contours of the liver without frank nodularity or visible lesion. Portal vein is patent on color Doppler imaging  with normal direction of blood flow towards the liver.   IVC: Limited assessment of the IVC mainly on transverse images, characteristic longitudinal image limited by overlying bowel gas. Areas that are well seen are unremarkable.   Pancreas: Visualized portion unremarkable.   Spleen: Size and appearance within normal limits.   Right Kidney: Length: 10.5 cm. Echogenicity within normal limits. No mass or hydronephrosis visualized.   Left Kidney: Length: 10.7 cm. Focal scarring in the interpolar LEFT kidney, no hydronephrosis or visible lesion.   Abdominal aorta: No aneurysm visualized. Limited assessment proximally due to overlying bowel gas.   Other findings: None.   ULTRASOUND HEPATIC ELASTOGRAPHY   Device: Siemens Helix VTQ   Patient position: Supine   Transducer C5-1   Number of measurements: 10   Hepatic segment:  VIII   Median kPa: 4.3   IQR: 0.6   IQR/Median kPa ratio: 0.1   Data quality:  Good   Diagnostic category:  < or = 5 kPa: high probability of being normal   The use of hepatic elastography is applicable to patients with viral hepatitis and non-alcoholic fatty liver disease. At this time, there is insufficient data for the referenced cut-off values and use in other causes of liver disease, including alcoholic liver disease. Patients, however, may be assessed by elastography and serve as their own reference standard/baseline.   In patients with non-alcoholic liver disease, the values suggesting compensated advanced chronic liver disease (cACLD) may be lower, and patients may need additional testing with elasticity results of 7-9 kPa.   Please note that abnormal hepatic elasticity and shear wave  velocities may also be identified in clinical settings other than with hepatic fibrosis, such as: acute hepatitis, elevated right heart and central venous pressures including use of beta blockers, veno-occlusive disease (Budd-Chiari), infiltrative  processes such as mastocytosis/amyloidosis/infiltrative tumor/lymphoma, extrahepatic cholestasis, with hyperemia in the post-prandial state, and with liver transplantation. Correlation with patient history, laboratory data, and clinical condition recommended.   Diagnostic Categories:   < or =5 kPa: high probability of being normal   < or =9 kPa: in the absence of other known clinical signs, rules out cACLD   >9 kPa and ?13 kPa: suggestive of cACLD, but needs further testing   >13 kPa: highly suggestive of cACLD   > or =17 kPa: highly suggestive of cACLD with an increased probability of clinically significant portal hypertension   IMPRESSION: ULTRASOUND ABDOMEN:   Mild hepatic steatosis with mildly lobular hepatic contours, no frank signs of cirrhosis with normal size spleen.   Mildly limited assessment due to overlying bowel gas with respect to mid abdominal structures as described.   ULTRASOUND HEPATIC ELASTOGRAPHY:   Median kPa:  4.3   Diagnostic category:  < or = 5 kPa: high probability of being normal    All pertinent labs/Imagings/notes reviewed. All pertinent plain films and CT images have been personally visualized and interpreted; radiology reports have been reviewed. Decision making incorporated into the Impression / Recommendations.  I spent 30 minutes with the patient including  review of prior medical records with greater than 50% of time in face to face counsel of the patient.    Electronically signed by:  [name], MD Infectious Disease Physician [facility] [address] [place], [place] [address] Phone: [phone]  Fax: [phone]

## 2021-09-08 ENCOUNTER — Encounter: Payer: Self-pay | Admitting: Family Medicine

## 2021-09-08 ENCOUNTER — Other Ambulatory Visit: Payer: Self-pay

## 2021-09-08 ENCOUNTER — Ambulatory Visit (INDEPENDENT_AMBULATORY_CARE_PROVIDER_SITE_OTHER): Payer: Medicare (Managed Care) | Admitting: Family Medicine

## 2021-09-08 VITALS — BP 138/98 | HR 66 | Wt 169.4 lb

## 2021-09-08 DIAGNOSIS — B182 Chronic viral hepatitis C: Secondary | ICD-10-CM

## 2021-09-08 DIAGNOSIS — R413 Other amnesia: Secondary | ICD-10-CM

## 2021-09-08 DIAGNOSIS — Z23 Encounter for immunization: Secondary | ICD-10-CM | POA: Diagnosis not present

## 2021-09-08 DIAGNOSIS — Z5181 Encounter for therapeutic drug level monitoring: Secondary | ICD-10-CM | POA: Diagnosis not present

## 2021-09-08 DIAGNOSIS — B351 Tinea unguium: Secondary | ICD-10-CM | POA: Diagnosis not present

## 2021-09-08 LAB — HEPATITIS C RNA QUANTITATIVE
HCV Quantitative Log: <1.18 log IU/mL
HCV RNA, PCR, QN: <15 IU/mL

## 2021-09-08 MED ORDER — TERBINAFINE HCL 250 MG PO TABS: 250.0000 mg | ORAL_TABLET | Freq: Every day | ORAL | 1 refills | Status: DC

## 2021-09-08 NOTE — Progress Notes (Signed)
    SUBJECTIVE:   CHIEF COMPLAINT / HPI:   Onychomycosis Patient presents with his ex-wife.  She helps take care of him.  She reports that he has been taking his medication as prescribed for the nail fungus but he has had no improvement.  He reports that the pain is considerable in his toes and he is afraid to cut his toenails.  Memory concerns Patient's wife is concerned regarding the patient's memory.  She reports that he will do things and not remember doing them at all.  She will have conversations with them but he will not remember.  When he leaves the office today she says that he will not know where he has been or why he was at the doctor's office.  These memory issues have been going on for over a year and he had an MRI as well as complete blood work approximately 1 year ago.  MRI showed no abnormal findings.   OBJECTIVE:   BP (!) 138/98   Pulse 66   Wt 169 lb 6 oz (76.8 kg)   SpO2 100%   BMI 25.75 kg/m   General: Well-appearing 65 year old male no acute distress Cardiac: Regular rate and rhythm Respiratory: Normal work of breathing, lungs clear to auscultation bilaterally Abdomen: Soft, nontender MSK: Patient with considerable overgrowth of nails on lower extremities.  See images below Neuro: Patient is oriented to person but not place or time.  MMSE score of 10 out of 30.  Very pleasant.  No gross focal neurologic deficits.  ASSESSMENT/PLAN:   Onychomycosis Patient has previously been seen for onychomycosis of his toes.  He has been taking terbinafine for the last month.  We are going to repeat CMP today to ensure LFTs are within normal limits and will continue terbinafine for an additional 2 months.  Prescription sent for this to his pharmacy.  Have sent in a referral for podiatry to help with nail trimming's and further management of this issue.  No further questions or concerns.  Memory impairment Unclear etiology of patient's cognitive impairment at this time.  He has  previously been seen for this and had brain MRI which was unrevealing.  Lab work was also unrevealing.  Concerned that this may be Alzheimer's dementia.  Patient needs further evaluation so we will refer her to our [place] clinic.  May need further testing by neuropsychiatric specialist.     Derrel Nip, MD [facility]

## 2021-09-08 NOTE — Patient Instructions (Signed)
It was great seeing you today.  Regarding Chad Daniel's feet he will need to continue taking the terbinafine.  I will need to get lab work today to make sure his liver enzymes are okay.  I have placed a referral for podiatry to help manage his nails.  Regarding his memory concerns I am sending a message to our scheduler regarding getting him scheduled in our geriatric clinic.  If you have any questions or concerns please feel free to call the clinic.  I hope you have a great afternoon!

## 2021-09-09 LAB — COMPREHENSIVE METABOLIC PANEL
ALT: 11 IU/L (ref 0–44)
AST: 22 IU/L (ref 0–40)
Albumin/Globulin Ratio: 1.6 (ref 1.2–2.2)
Albumin: 4.8 g/dL (ref 3.8–4.8)
Alkaline Phosphatase: 99 IU/L (ref 44–121)
BUN/Creatinine Ratio: 14 (ref 10–24)
BUN: 15 mg/dL (ref 8–27)
Bilirubin Total: 0.3 mg/dL (ref 0.0–1.2)
CO2: 25 mmol/L (ref 20–29)
Calcium: 8.8 mg/dL (ref 8.6–10.2)
Chloride: 102 mmol/L (ref 96–106)
Creatinine, Ser: 1.05 mg/dL (ref 0.76–1.27)
Globulin, Total: 3 g/dL (ref 1.5–4.5)
Glucose: 82 mg/dL (ref 65–99)
Potassium: 4.1 mmol/L (ref 3.5–5.2)
Sodium: 140 mmol/L (ref 134–144)
Total Protein: 7.8 g/dL (ref 6.0–8.5)
eGFR: 79 mL/min/{1.73_m2} (ref >59)

## 2021-09-09 NOTE — Assessment & Plan Note (Signed)
Patient has previously been seen for onychomycosis of his toes.  He has been taking terbinafine for the last month.  We are going to repeat CMP today to ensure LFTs are within normal limits and will continue terbinafine for an additional 2 months.  Prescription sent for this to his pharmacy.  Have sent in a referral for podiatry to help with nail trimming's and further management of this issue.  No further questions or concerns.

## 2021-09-09 NOTE — Assessment & Plan Note (Signed)
Unclear etiology of patient's cognitive impairment at this time.  He has previously been seen for this and had brain MRI which was unrevealing.  Lab work was also unrevealing.  Concerned that this may be Alzheimer's dementia.  Patient needs further evaluation so we will refer her to our [place] clinic.  May need further testing by neuropsychiatric specialist.

## 2021-09-11 ENCOUNTER — Telehealth: Payer: Self-pay

## 2021-09-11 NOTE — Telephone Encounter (Signed)
Called patient to relay results, no answer. Left HIPAA compliant voicemail requesting callback.   Cain Fitzhenry D Camarion Weier, RN  

## 2021-09-11 NOTE — Telephone Encounter (Signed)
-----   Message from [name], MD sent at 09/11/2021  8:34 AM EDT ----- Please let patient or family know his HCV RNA is not detected and he has been successfully treated for hepatitis C.Chad Daniel

## 2021-09-11 NOTE — Telephone Encounter (Signed)
[name] returned the call. RN relayed results, they were pleased and had no further questions. Andree Coss, RN

## 2021-09-11 NOTE — Progress Notes (Signed)
Please let patient or family know his HCV RNA is not detected and he has been successfully treated for hepatitis C.Marland Kitchen

## 2021-09-12 ENCOUNTER — Other Ambulatory Visit: Payer: Self-pay | Admitting: *Deleted

## 2021-09-12 DIAGNOSIS — Z0189 Encounter for other specified special examinations: Secondary | ICD-10-CM

## 2021-09-12 DIAGNOSIS — R413 Other amnesia: Secondary | ICD-10-CM

## 2021-09-12 NOTE — Progress Notes (Signed)
CCM referral placed for patient's upcoming geri appt.  LM ok per DPR with expectations for the appt along with date/time.  Nashay Brickley,CMA

## 2021-09-12 NOTE — Progress Notes (Signed)
LM for patient/wife with appt information and what they needed to bring.  Chad Daniel,CMA

## 2021-09-18 ENCOUNTER — Telehealth: Payer: Self-pay | Admitting: *Deleted

## 2021-09-18 NOTE — Chronic Care Management (AMB) (Signed)
  Care Management   Outreach Note  09/18/2021 Name: Chad Daniel MRN: [id number] DOB: November 07, 1956  Referred by: Derrel Nip, MD Reason for referral : Care Coordination (Initial outreach to schedule referral with Licensed Clinical SW )   An unsuccessful telephone outreach was attempted today. The patient was referred to the case management team for assistance with care management and care coordination.   Follow Up Plan:  A HIPAA compliant phone message was left for the patient providing contact information and requesting a return call. The care management team will reach out to the patient again over the next 7 days. If patient returns call to provider office, please advise to call Embedded Care Management Care Guide Misty Stanley at [phone].  Gwenevere Ghazi  Care Guide, Embedded Care Coordination [facility]  Direct Dial: [phone]

## 2021-09-25 NOTE — Chronic Care Management (AMB) (Signed)
  Care Management   Outreach Note  09/25/2021 Name: Chad Daniel MRN: [id number] DOB: 1956-05-25  Referred by: Derrel Nip, MD Reason for referral : Care Coordination (Initial outreach to schedule referral with Licensed Clinical SW )   A second unsuccessful telephone outreach was attempted today. The patient was referred to the case management team for assistance with care management and care coordination.   Follow Up Plan:  A HIPAA compliant phone message was left for the patient providing contact information and requesting a return call. The care management team will reach out to the patient again over the next 7 days. If patient returns call to provider office, please advise to call Embedded Care Management Care Guide Misty Stanley at [phone].  Gwenevere Ghazi  Care Guide, Embedded Care Coordination [facility] Management  Direct Dial: [phone]

## 2021-10-02 NOTE — Chronic Care Management (AMB) (Signed)
  Care Management   Outreach Note  10/02/2021 Name: Chad Daniel MRN: [id number] DOB: 1956-04-02  Referred by: Derrel Nip, MD Reason for referral : Care Coordination (Initial outreach to schedule referral with Licensed Clinical SW )   Third unsuccessful telephone outreach was attempted today. The patient was referred to the case management team for assistance with care management and care coordination. The patient's primary care provider has been notified of our unsuccessful attempts to make or maintain contact with the patient. The care management team is pleased to engage with this patient at any time in the future should he/she be interested in assistance from the care management team.   Follow Up Plan:  We have been unable to make contact with the patient. The care management team is available to follow up with the patient after provider conversation with the patient regarding recommendation for care management engagement and subsequent re-referral to the care management team. A HIPAA compliant phone message was left for the patient providing contact information and requesting a return call. If patient returns call to provider office, please advise to call Embedded Care Management Care Guide Misty Stanley at [phone].  Gwenevere Ghazi  Care Guide, Embedded Care Coordination [facility] Management  Direct Dial: [phone]

## 2021-10-04 ENCOUNTER — Telehealth: Payer: Self-pay | Admitting: *Deleted

## 2021-10-04 NOTE — Chronic Care Management (AMB) (Signed)
  Care Management   Note  10/04/2021 Name: Chad Daniel MRN: [id number] DOB: 02-Aug-1956  Chad Daniel is a 65 y.o. year old male who is a primary care patient of Derrel Nip, MD. I reached out to Jerilynn Mages by phone today in response to a referral sent by Mr. Chad Daniel's primary care provider.   [name] was given information about care management services today including:  Care management services include personalized support from designated clinical staff supervised by his physician, including individualized plan of care and coordination with other care providers 24/7 contact phone numbers for assistance for urgent and routine care needs. The patient may stop care management services at any time by phone call to the office staff.  Bluford Kaufmann ex-wife DPR on file   verbally agreed to assistance and services provided by embedded care coordination/care management team today.  Follow up plan: Telephone appointment with care management team member scheduled for:10/09/21  [facility] Guide, Embedded Care Coordination [facility]  Care Management  Direct Dial: [phone]

## 2021-10-05 ENCOUNTER — Telehealth: Payer: Medicare (Managed Care)

## 2021-10-09 ENCOUNTER — Encounter: Payer: Self-pay | Admitting: Licensed Clinical Social Worker

## 2021-10-09 ENCOUNTER — Ambulatory Visit: Payer: Medicare (Managed Care) | Admitting: Licensed Clinical Social Worker

## 2021-10-09 DIAGNOSIS — Z139 Encounter for screening, unspecified: Secondary | ICD-10-CM

## 2021-10-09 DIAGNOSIS — Z0189 Encounter for other specified special examinations: Secondary | ICD-10-CM

## 2021-10-09 NOTE — Patient Instructions (Signed)
Licensed Clinical Social Worker Visit Information  Goals we discussed today:   Goals Addressed             This Visit's Progress    Caregiver Coping Optimized       Patient Goals/Self-Care Activities: Over the next 14 days Call Department of Social Services [phone] to apply for Medicaid Work on getting current photo ID     Effective Long-Term Care Planning       Patient Goals/Self-Care Activities : Over the next 30 days Review educational information on Advance Directive  Complete Advance Directive packet,  Have advance directive notarized and provide a copy to provider office Call LCSW if you have questions            Mr. Monday was given information about Care Management services today including:  Care Management services include personalized support from designated clinical staff supervised by his physician, including individualized plan of care and coordination with other care providers 24/7 contact phone numbers for assistance for urgent and routine care needs. The patient may stop Care Management services at any time by phone call to the office staff.  It was a pleasure speaking with you today. Please call the office if needed Patient's caregiver verbalizes understanding of instructions provided today.  Follow up appointment is scheduled 10/26/21  Sammuel Hines, LCSW Care Management & Coordination  [phone]

## 2021-10-09 NOTE — Chronic Care Management (AMB) (Signed)
Care Management Clinical Social Work  [facility]    10/09/2021 Name: Mitchell Iwanicki MRN: [id number] DOB: 11/07/1956  Haydyn Girvan is a 65 y.o. year old male who sees Derrel Nip, MD for primary care.    Collaboration with Patient's ex-wife [name]/ caregiver  for initial visit in response to a referral from Dr. McDiarmid for social work care coordination services to assess needs, support and barriers to care prior to [facility] clinic appointment  Consent to Services:  Mr. Ottaviano was given information about Care Management services today including:  Care Management services includes personalized support from designated clinical staff supervised by his physician, including individualized plan of care and coordination with other care providers 24/7 contact phone numbers for assistance for urgent and routine care needs. The patient may stop case management services at any time by phone call to the office staff.  Patient's caregiver agreed to services and consent obtained.    (Look under patient's history for social information from assessment) Assessment:Assessed patient's previous and current treatment, coping skills, support system and barriers to care. Patient's caregiver provided all information during this encounter. Patient is currently experiencing difficulty with his memory and confusion , does not remember if her ate, and increase in poor hygiene; .Marland Kitchen Reviewed [facility] clinic process and what to expect during the office visit. See Care Plan below for interventions and patient self-care actives.  Recent life changes /stressors: moved from [place] last year to [facility]; moved in with ex-wife and daughter; was living on the street as homeless.  Recommendation: Patient may benefit from, and family is in agreement to apply for Medicaid, get current photo ID, and pick up Advance Directive paper work at front office.   Follow up Plan: Patient's caregiver would like continued  follow-up from CCM LCSW .  per caregiver's request CCM LCSW will follow up in 2 weels.  They will call the office if needed prior to next encounter.   Review of patient past medical history, allergies, medications, and health status, including review of relevant consultants reports was performed today as part of a comprehensive evaluation and provision of chronic care management and care coordination services.  SDOH(Social Determinants of Health) assessments and interventions performed:    Advanced Directives Status: See Care Plan for related entries.  Care Plan  Conditions to be addressed/monitored: ; ADL IADL limitations and Memory Deficits  Care Plan : General Social Work (Adult)  Updates made by Soundra Pilon, LCSW since 10/09/2021 12:00 AM     Problem: No advance directive      Goal: Effective Long-Term Care Planning   Start Date: 10/09/2021  This Visit's Progress: On track  Priority: High  Note:   Current barriers:   Patient does not have an advance directive.  Needs education, support and coordination in order to meet this need. Clinical Goal(s): Over the next 30 days, the patient will review information on advance directive, complete advance directive packet and have notarized.  Interventions: Assessed understanding of Advance Directives. A voluntary discussion about advanced care planning including importance of advanced directives, healthcare proxy and living will was discussed with the patient's ex-wife [name].  She was HPOA but unable to locate the paperwork.  Will talk with patient to do new HPOA. Reviewed information on Advance Directive.  Inter-disciplinary care team collaboration (see longitudinal plan of care) Patient Goals/Self-Care Activities : Over the next 30 days Review educational information on Advance Directive  Complete Advance Directive packet,  Have advance directive notarized and provide a copy  to provider office Call LCSW if you have questions         Problem: Caregiver Stress      Goal: Caregiver Coping Optimized by connecting for community support   Start Date: 10/09/2021  This Visit's Progress: On track  Priority: High  Note:   Current barriers:    Level of care concerns, ADL IADL limitations, and Memory Deficits Clinical Goals: Patient's family will work with LCSW to address needs related to level of care Clinical Interventions:  Assessment of needs, barriers , agencies contacted, as well as how impacting  Solution-Focused Strategies employed: caregiver support options  Active listening / Reflection utilized  Caregiver stress acknowledged  Discussed Health Care Power of Attorney  Review various resources, discussed options and provided patient information about  Department of Social Services ( to apply for [employer] ) [employer] Alternative Program CAP Will apply after Geri appointment Inter-disciplinary care team collaboration (see longitudinal plan of care) Patient Goals/Self-Care Activities: Over the next 14 days Call Department of Social Services [phone] to apply for Medicaid Work on getting current photo ID      Sammuel Hines, LCSW Care Management & Coordination  [facility] / [employer]   [phone]

## 2021-10-10 NOTE — Progress Notes (Signed)
I have reviewed and agree with Ms. Moore's assessment and plan.

## 2021-10-19 ENCOUNTER — Ambulatory Visit: Payer: Medicare (Managed Care) | Admitting: Family Medicine

## 2021-10-19 ENCOUNTER — Other Ambulatory Visit: Payer: Self-pay

## 2021-10-19 ENCOUNTER — Ambulatory Visit (INDEPENDENT_AMBULATORY_CARE_PROVIDER_SITE_OTHER): Payer: Medicare (Managed Care)

## 2021-10-19 VITALS — BP 128/78 | HR 72 | Ht 68.0 in | Wt 171.0 lb

## 2021-10-19 DIAGNOSIS — R413 Other amnesia: Secondary | ICD-10-CM

## 2021-10-19 DIAGNOSIS — K760 Fatty (change of) liver, not elsewhere classified: Secondary | ICD-10-CM | POA: Diagnosis not present

## 2021-10-19 DIAGNOSIS — F1097 Alcohol use, unspecified with alcohol-induced persisting dementia: Secondary | ICD-10-CM

## 2021-10-19 DIAGNOSIS — E538 Deficiency of other specified B group vitamins: Secondary | ICD-10-CM

## 2021-10-19 DIAGNOSIS — B182 Chronic viral hepatitis C: Secondary | ICD-10-CM

## 2021-10-19 DIAGNOSIS — F03A0 Unspecified dementia, mild, without behavioral disturbance, psychotic disturbance, mood disturbance, and anxiety: Secondary | ICD-10-CM | POA: Diagnosis not present

## 2021-10-19 DIAGNOSIS — Z23 Encounter for immunization: Secondary | ICD-10-CM

## 2021-10-19 NOTE — Progress Notes (Signed)
S: Pharmacy consulted to complete medication reconciliation for geriatric clinic. Patient arrives in good spirits accompanied by a caregiver who manages his medications and helped complete the medication reconciliation today. Medications, allergies, and preferred pharmacy are up to date.   Medication Issues Identified: 1. Due for COVID bivalent booster and Shingrix vaccines 2. Due for updated lipid panel   Plan: 1. Consider administering COVID bivalent booster and Shingrix vaccines 2. Check fasting lipid panel. If LDL >100, consider initiating statin for primary prevention.     Patient seen by: Ruben Im, Student Pharmacist, and Pervis Hocking, PharmD, PGY2 Pharmacy Resident

## 2021-10-19 NOTE — Patient Instructions (Addendum)
Thank you again for coming into the [facility] on 10/19/21 .  It was our opinion that you have mild dementia that may interfere with your memory and knowing where you are at all times.  The dementia is likely due to the strokes you have had in the past.  T Thinking and memory can become more impaired over time.  It is a good idea to choose someone to manage your money and property, if you were not able.  A document called a Durable Power of Attorney lets the banks and courts know who you would want managing your money and property.  Lawyers who do [employer] or [employer] can help you make a Engineer, agricultural.   It is also a good idea to choose someone ton make decisions about your health care, if you were not able.  A document called a Health Care Power of Attorney can let your doctors and nurses know who you want making decisions about your health.   You make choose the same person for both types of Powers of Attorney or different ones.  While lawyers can also help you with making a Health Care Power of Attorney, you may also ask your doctor's office to help.   We drew blood tests today to look for other causes of memory problems.  When you follow up with your primary care doctor, they will go over the results of these tests with you.

## 2021-10-19 NOTE — Assessment & Plan Note (Signed)
Has had sustain viral response c/w virologic cure

## 2021-10-19 NOTE — Progress Notes (Addendum)
Provider:  Lissa Morales, MD Location:      Place of Service:     PCP: Chad Shave, MD Patient Care Team: Chad Shave, MD as PCP - General (Family Medicine) Chad Epley, MD as PCP - Electrophysiology (Cardiology) Chad Cane, LCSW as Social Worker (Licensed Clinical Social Worker)  Extended Emergency Contact Information Primary Emergency Contact: Chad Daniel, Chad Daniel Mobile Phone: [phone] Relation: Spouse  Code Status: DNR Goals of Care: Advanced Directive information Advanced Directives 10/19/2021  Does Patient Have a Medical Advance Directive? No  Would patient like information on creating a medical advance directive? Yes (MAU/Ambulatory/Procedural Areas - Information given)     [facility]:   Patient is accompanied by  Ex-wife Primary caregiver:  Ex-wife Patient's Currently living arrangement:  daughter Chad Daniel) and Ex-wife Chad Daniel) Patient information was obtained from historical medical records, lab reports, office notes, and MRI reports   . History/Exam limitations:  cognitive impairment Primary Care Provider:   Junius Roads,  MD Referring provider:  Junius Roads,  MD Reason for referral:  Memory difficulties  ----------------------------------------------------------------------------------------------------------------------------------------------------------------------------------------------------------------------------------------------------------------   HPI by problems:  Chief Complaint  Patient presents with   memory concerns    Cognitive impairment concern  Are there problems with thinking?  Cognition domains: Memory difficulties, Orientation to time difficulty, Learning difficulty with new activities or information, Attention difficulties , and Planning difficulties  When were the changes first noticed?  2 years  Did this change occur abruptly or gradually?   gradual  How have the changes progressed since then?  gradually worsening  Has there been any tremors or abnormal movements?  no  Have they had in hallucinations or delusions:  no  Have they appeared more anxious or sad lately?  no  Do they still have interests or activities they enjor doing?  no  How has their appetite been lately?  show no change  How has their sleep been lately?  Well   Compared to 5 to 10 years ago, how is the patient at:  Problem remembering things about family and friends e.g. names,  occupations, birthdays, addresses?  yes, his children's name, but still recognizes them  Problem remembering conversations or news events a few days later?  yes  Problem remembering what day and month it is? yes  Problem with losing things?  yes  Problem learning to use a new gadget or machine around the house, e.g., cell phones, computer, microwave, remote control?  yes  Problem with handling money for shopping?  yes  Problem handling financial matters, e.g. their pension, checking, credit cards, dealing with the bank?  yes  Problem with getting lost in familiar places?  no  PMH:  Long-history of alcohol and polysubstance abuse, "Anything I could find on the street, pills and such," he said.  It included IVDA of heroin and other drugs.   Geriatric Depression Scale:  Patient was unable to complete GDS because thinking about the lose of his activities on the street resulted in his tearing up.   PHQ-9: [name] Office Visit from 11/28/2020 in [facility] for Infectious Disease  PHQ-9 Total Score 0        Outpatient Encounter Medications as of 10/19/2021  Medication Sig   acetaminophen (TYLENOL) 500 MG tablet Take 500 mg by mouth every 8 (eight) hours as needed for headache.   Cyanocobalamin (B-12) 5000 MCG CAPS Please take 1 capsule daily.   [DISCONTINUED] terbinafine (LAMISIL) 250 MG tablet Take 1 tablet (  250 mg total) by mouth  daily.   No facility-administered encounter medications on file as of 10/19/2021.    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 Family History  Problem Relation Age of Onset   Hypertension Mother    Hypertension Father    Social History   Socioeconomic History   Marital status: Legally Separated    Spouse name: Not on file   Number of children: Not on file   Years of education: Not on file   Highest education level: Not on file  Occupational History   Not on file  Tobacco Use   Smoking status: Heavy Smoker    Packs/day: 1.00    Types: Cigarettes   Smokeless tobacco: Never  Substance and Sexual Activity   Alcohol use: Never   Drug use: Never   Sexual activity: Not Currently  Other Topics Concern   Not on file  Social History Narrative   ** Merged History Encounter **             Social Information    patient lives with daughter, ex-wife, and mother in-law  , . Patient enjoys watching TV, smoking, sitting outside on the family deck . Primary family support persons is ex-wife and daughter.    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     Social Determinants of Health   Financial Resource Strain: Low Risk     Difficulty of Paying Living Expenses: Not very hard  Food Insecurity: No Food Insecurity   Worried About Charity fundraiser in the Last Year: Never true   Ran Out of Food in the Last Year: Never true  Transportation Needs: No Transportation Needs   Lack of Transportation (Medical): No   Lack of Transportation (Non-Medical): No  Physical Activity: Not on file  Stress: Not on file  Social Connections: Not on file    [facility] Cognitive Assessment  10/19/2021  Visuospatial/ Executive (0/5) 0  Naming (0/3) 2  Attention: Read list of digits (0/2) 1  Attention: Read list of letters (0/1) 1  Attention: Serial 7 subtraction starting at 100 (0/3) 0  Language: Repeat phrase (0/2) 0  Language : Fluency (0/1) 0  Abstraction (0/2) 1  Delayed Recall (0/5) 0  Orientation (0/6) 1  Total 6  Adjusted Score (based on education) 7        Lab Results  Component Value Date   HGBA1C 5.8 (H) 10/12/2020     _0 @ Hearing Screening  Method: Audiometry   500Hz 1000Hz 2000Hz 4000Hz  Right ear _1 Left ear _2 Vision Screening   Right eye Left eye Both eyes  Without correction 20/40 20/40 20/40  With correction      Lab Results  Component Value Date   WBC 5.3 10/15/2020   HGB 12.6 (L) 10/15/2020   HCT 38.9 (L) 10/15/2020   MCV 81.2 10/15/2020   PLT 180 10/15/2020    Results for orders placed or performed in visit on 10/19/21 (from the past 24 hour(s))  CMP14+EGFR   Collection Time: 10/19/21  2:59 PM  Result Value Ref Range   Glucose 92 70 - 99 mg/dL   BUN 17 8 - 27 mg/dL   Creatinine, Ser 1.09 0.76 - 1.27 mg/dL   eGFR 75 >59 mL/min/1.73   BUN/Creatinine Ratio 16 10 - 24   Sodium 139 134 - 144 mmol/L   Potassium 4.1 3.5 - 5.2 mmol/L   Chloride 100 96 - 106 mmol/L   CO2 21 20 - 29 mmol/L   Calcium 9.2 8.6 - 10.2 mg/dL   Total Protein 7.5 6.0 - 8.5 g/dL   Albumin 4.7 3.8 - 4.8 g/dL   Globulin, Total 2.8 1.5 - 4.5 g/dL   Albumin/Globulin Ratio 1.7  1.2 - 2.2   Bilirubin Total 0.3 0.0 - 1.2 mg/dL   Alkaline Phosphatase 105 44 - 121 IU/L   AST 20 0 - 40 IU/L   ALT 9 0 - 44 IU/L  Vitamin B12   Collection Time: 10/19/21  2:59 PM  Result Value Ref Range   Vitamin B-12 1,280 (H) 232 - 1,245 pg/mL   Brain MRI (10/12/20): Unremarkable.  Assessment and Plan: Please see individual consultation notes from pharmacy and CCM's SDoH evaluations and recommendaitons.    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 I suspect that this earlier age of dementia onset due to the patient's extensive alcohol and polysubstance use disorder history.    Vitamin B12 which was slightly low has been repleted without significant improvement in cognition per his primary caretaker and Ex-wife, Chad Daniel.   Recent Brain MRI unremarkable  We discussed community support resources, including Alzheimer's Association.  His primary caretaker did not feel the need for further support or information about Adult Day Care programs.   NIH booklet for caretakers of patient's with dementia given to [facility].    Other Visit Diagnoses     Vitamin B12 deficiency       Relevant Orders   Vitamin B12 Established problem Well Controlled. Serum Vit B12 level > 1000 with taking 1000 units daily.  Patient is eating beef almost daily.    Can consider stopping or reducing Vit B12 supplement.       Chronic hepatitis C without hepatic coma (HCC) Has had sustain viral response c/w virologic cure  Vitamin B12 deficiency Established problem Well Controlled. Serum Vit B12 level > 1000 with taking  1000 units daily.  Patient is eating beef almost daily.    Can consider stopping or reducing Vit B12 supplement.    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  Long-history of alcohol and polysubstance abuse, "Anything I could  find on the street, pills and such," he said.  It included IVDA of heroin and other drugs.   Vitamin B12 which was slightly low has been repleted without significant improvement in cognition per his primary caretaker and Ex-wife, Chad Daniel.   Recent Brain MRI unremarkable  We discussed community support resources, including Alzheimer's Association.  His primary caretaker did not feel the need for further support or information about Adult Day Care programs.   NIH booklet for caretakers of patient's with dementia given to [facility].        Primary Contact: Extended Emergency Contact Information Primary Emergency Contact: Chad Daniel, Chad Daniel Mobile Phone: [phone] Relation: Spouse  Patient to Follow up with Dr Caron Presume in a month  54 minutes face to face were spent in total with interdisciplinary discussion, patient and caretaker counseling and coordination of care took more than 20 minutes. The Geriatric interdisciplinary team meet to discuss the patient's assessment, problem list, and recommendations.  The interdisciplinary team consisted of representatives from medicine, pharmacy, and social work. The interdisciplinary team meet with the patient and caretakers to review the team's findings, assessments, and recommendations.

## 2021-10-20 ENCOUNTER — Telehealth: Payer: Self-pay

## 2021-10-20 DIAGNOSIS — E538 Deficiency of other specified B group vitamins: Secondary | ICD-10-CM | POA: Insufficient documentation

## 2021-10-20 LAB — CMP14+EGFR
ALT: 9 IU/L (ref 0–44)
AST: 20 IU/L (ref 0–40)
Albumin/Globulin Ratio: 1.7 (ref 1.2–2.2)
Albumin: 4.7 g/dL (ref 3.8–4.8)
Alkaline Phosphatase: 105 IU/L (ref 44–121)
BUN/Creatinine Ratio: 16 (ref 10–24)
BUN: 17 mg/dL (ref 8–27)
Bilirubin Total: 0.3 mg/dL (ref 0.0–1.2)
CO2: 21 mmol/L (ref 20–29)
Calcium: 9.2 mg/dL (ref 8.6–10.2)
Chloride: 100 mmol/L (ref 96–106)
Creatinine, Ser: 1.09 mg/dL (ref 0.76–1.27)
Globulin, Total: 2.8 g/dL (ref 1.5–4.5)
Glucose: 92 mg/dL (ref 70–99)
Potassium: 4.1 mmol/L (ref 3.5–5.2)
Sodium: 139 mmol/L (ref 134–144)
Total Protein: 7.5 g/dL (ref 6.0–8.5)
eGFR: 75 mL/min/{1.73_m2} (ref >59)

## 2021-10-20 LAB — VITAMIN B12: Vitamin B-12: 1280 pg/mL — ABNORMAL HIGH (ref 232–1245)

## 2021-10-20 MED ORDER — TERBINAFINE HCL 250 MG PO TABS: 250.0000 mg | ORAL_TABLET | Freq: Every day | ORAL | 1 refills | Status: AC

## 2021-10-20 NOTE — Telephone Encounter (Signed)
Prescription sent per patient's pharmacy request.

## 2021-10-20 NOTE — Telephone Encounter (Signed)
Received message from [facility] pharmacy: Terbinafine 250 mg tablet 28 tab, 1 refill Take 1 tablet by mouth daily  PA required for QTY. Plan currently limist are #90 / 365 days. Please call [phone], member id [id number]  Sunday Spillers, CMA

## 2021-10-20 NOTE — Assessment & Plan Note (Signed)
Established problem Well Controlled. Serum Vit B12 level > 1000 with taking 1000 units daily.  Patient is eating beef almost daily.    Can consider stopping or reducing Vit B12 supplement.

## 2021-10-20 NOTE — Assessment & Plan Note (Addendum)
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 I suspect that this earlier age of dementia onset due to the patient's extensive alcohol and polysubstance use disorder history.    Vitamin B12 which was slightly low has been repleted without significant improvement in cognition per his primary caretaker and Ex-wife, Chad Daniel.   Recent Brain MRI unremarkable  We discussed community support resources, including Alzheimer's Association.  His primary caretaker did not feel the need for further support or information about Adult Day Care programs.   NIH booklet for caretakers of patient's with dementia given to [facility].

## 2021-10-26 ENCOUNTER — Ambulatory Visit: Payer: Medicare (Managed Care) | Admitting: Licensed Clinical Social Worker

## 2021-10-26 DIAGNOSIS — Z7189 Other specified counseling: Secondary | ICD-10-CM

## 2021-10-26 DIAGNOSIS — Z636 Dependent relative needing care at home: Secondary | ICD-10-CM

## 2021-10-26 NOTE — Patient Instructions (Signed)
Licensed Clinical Social Worker Visit Information  Goals we discussed today:   Goals Addressed             This Visit's Progress    Caregiver Coping Optimized       Patient Goals/Self-Care Activities:  Call Department of Social Services [phone] to follow up on Medicaid application I have completed the CAPS referral; you will received the CAPS application packet from [facility] Complete your part and bring other part in for doctor to complete Work on getting current photo ID     Effective Long-Term Care Planning   Not on track    Patient Goals/Self-Care Activities : Over the next 30 days Review educational information on Advance Directive  Complete Advance Directive packet,  Have advance directive notarized and provide a copy to provider office Call LCSW if you have questions           Patient's caregiver verbally agreed to assistance and services provided by embedded care coordination/care management team today.  It was a pleasure speaking with you today. Follow up appointment is scheduled 11/28/2021 Patient's caregiver verbalized understanding of instructions, educational materials, and care plan provided today and declined offer to receive copy of patient instructions, educational materials, and care plan.  Sammuel Hines, LCSW Care Management & Coordination  [phone]

## 2021-10-26 NOTE — Chronic Care Management (AMB) (Signed)
Care Management  Clinical Social Work Note  10/26/2021 Name: Chad Daniel MRN: [id number] DOB: 1956-05-25  Chad Daniel is a 65 y.o. year old male who is a primary care patient of Chad Nip, MD. The CCM team was consulted for assistance with care coordination needs. Level of Care Concerns and Caregiver Stress   Consent to Services:  The patient was given information about Care Management services, agreed to services, and gave verbal consent prior to initiation of services.  Please see initial visit note for detailed documentation.   Patient agreed to services today and consent obtained.  Collaboration with Chad Daniel's patient's caregiver  for follow up visit in response to provider referral for social work care coordination services.   Assessment/Interventions: Assessed patient's current treatment, progress, coping skills, support system and barriers to care.  Patient's caregiver provided all information during this encounter.Caregiver continues to work on getting patient's photo ID.She has applied for Medicaid and would like to move forward with the CAPS application. See Care Plan below for interventions and patient self-care actives.  Follow up Plan: Patient's caregiver would like continued follow-up from CCM LCSW .  per caregiver's request CCM LCSW will follow up in 30 days.  They will call the office if needed prior to next encounter.   Review of patient past medical history, allergies, medications, and health status, including review of pertinent consultant reports was performed as part of comprehensive evaluation and provision of care management/care coordination services.   SDOH (Social Determinants of Health) screening and interventions performed today:   Advanced Directives Status:See Care Plan for related entries     Care Plan    Conditions to be addressed/monitored per PCP order: Dementia, Level of care concerns  Care Plan : General Social Work (Adult)  Updates made by  Chad Pilon, LCSW since 10/26/2021 12:00 AM     Problem: No advance directive      Goal: Effective Long-Term Care Planning   Start Date: 10/09/2021  This Visit's Progress: On track  Recent Progress: On track  Priority: High  Note:   Current barriers:   Unable to complete process without photo ID working on getting this Patient does not have an advance directive.  Needs education, support and coordination in order to meet this need. Clinical Goal(s): Over the next 30 days, the patient will review information on advance directive, complete advance directive packet and have notarized.  Interventions: Assessed understanding of Advance Directives. A voluntary discussion about advanced care planning including importance of advanced directives, healthcare proxy and living will was discussed with the patient's ex-wife Chad Daniel.  She was HPOA but unable to locate the paperwork.  Will talk with patient to do new HPOA. Reviewed information on Advance Directive.  Inter-disciplinary care team collaboration (see longitudinal plan of care) Patient Goals/Self-Care Activities : Over the next 30 days Review educational information on Advance Directive  Complete Advance Directive packet,  Have advance directive notarized and provide a copy to provider office Call LCSW if you have questions        Problem: Caregiver Stress      Goal: Caregiver Coping Optimized by connecting for community support   Start Date: 10/09/2021  This Visit's Progress: On track  Recent Progress: On track  Priority: High  Note:   Current barriers:    Level of care concerns, ADL IADL limitations, and Memory Deficits Clinical Goals: Patient's family will work with LCSW to address needs related to level of care Clinical Interventions:  Assessment of needs, barriers ,  agencies contacted, as well as how impacting  Solution-Focused Strategies employed: caregiver support options  Active listening / Reflection utilized   Caregiver stress acknowledged  Discussed Health Care Power of Attorney  Review various resources, discussed options and provided patient information about  Department of Social Services ( to apply for [employer] ) [employer] CAP Collaborated with CAPS coordinator and completed CAPS referral; referral e-mail 10/26/2021 Inter-disciplinary care team collaboration (see longitudinal plan of care) Patient Goals/Self-Care Activities: Over the next 30 days Call Department of Social Services [phone] to follow up on Medicaid application I have completed the CAPS referral; you will received the CAPS application packet from [facility] Complete your part and bring other part in for doctor to complete Work on getting current photo ID    Chad Hines, LCSW Care Management & Coordination  [facility] / [employer]   [phone]

## 2021-11-27 ENCOUNTER — Ambulatory Visit: Payer: Medicare (Managed Care) | Admitting: Family Medicine

## 2021-11-28 ENCOUNTER — Ambulatory Visit: Payer: Medicare (Managed Care) | Admitting: Licensed Clinical Social Worker

## 2021-11-28 DIAGNOSIS — F1097 Alcohol use, unspecified with alcohol-induced persisting dementia: Secondary | ICD-10-CM

## 2021-11-28 DIAGNOSIS — R413 Other amnesia: Secondary | ICD-10-CM

## 2021-11-28 DIAGNOSIS — Z741 Need for assistance with personal care: Secondary | ICD-10-CM

## 2021-11-28 NOTE — Patient Instructions (Signed)
Visit Information  Thank you for taking time to visit with me today. Please don't hesitate to contact me if I can be of assistance to you before our next scheduled telephone appointment.  Following are the goals we discussed today: Meeting Personal Care needs Patient Self-Care Activities:  I have completed the CAPS referral; you will received the CAPS application packet from [facility] Complete your part and bring other part in for doctor to complete Work on getting current photo ID  Our next appointment is by telephone on 12/19 at 2:00  Please call the care guide team at [phone] if you need to cancel or reschedule your appointment.   If you are experiencing a Mental Health or Behavioral Health Crisis or need someone to talk to, please call the Suicide and Crisis Lifeline: 988 call the [place] National Suicide Prevention Lifeline: 218-028-5354 or TTY: 772-839-1085 TTY (239)696-3915) to talk to a trained counselor call 1-800-273-TALK (toll free, 24 hour hotline) go to [facility] [address], [place] [phone]) call 911   Please call the office if needed Follow up appointment is scheduled 12/11/21 Patient's caregiver verbalizes understanding of instructions provided today and agrees to view in MyChart. Patient's caregiver verbalized understanding of instructions, educational materials, and care plan provided today and declined offer to receive copy of patient instructions, educational materials, and care plan.  Sammuel Hines, LCSW Care Management & Coordination  [phone]

## 2021-11-28 NOTE — Chronic Care Management (AMB) (Signed)
Care Management  Clinical Social Work Note  11/28/2021 Name: Chad Daniel MRN: [id number] DOB: June 10, 1956  Chad Daniel is a 65 y.o. year old male who is a primary care patient of Derrel Nip, MD. The CCM team was consulted for assistance with care coordination needs. Level of Care Concerns   Consent to Services:  The patient was given information about Care Management services, agreed to services, and gave verbal consent prior to initiation of services.  Please see initial visit note for detailed documentation.   Patient agreed to services today and consent obtained.  Collaboration with patient's ex-wife [name]  for follow up visit in response to provider referral for social work care coordination services. Patient's care giver/ ex-wife provided all information during this encounter..   Assessment: Patient has been approved for Medicaid but waiting on the medicaid number, will complete PCS referral during next encounter. Review of patient past medical history, allergies, medications, and health status, including review of pertinent consultant reports was performed as part of comprehensive evaluation and provision of care management/care coordination services. See Care Plan below for interventions and patient self-care actives.   SDOH (Social Determinants of Health) screening and interventions performed today:   Advanced Directives Status:Not addressed in this encounter.     Care Plan    Conditions to be addressed/monitored per PCP order: Dementia, Level of care concerns  Care Plan : General Social Work (Adult)  Updates made by Soundra Pilon, LCSW since 11/28/2021 12:00 AM     Problem: Caregiver Stress      Goal: Caregiver Coping Optimized by connecting for community support   Start Date: 10/09/2021  This Visit's Progress: On track  Recent Progress: On track  Priority: High  Note:   Current barriers:    Level of care concerns, ADL IADL limitations, and Memory  Deficits Clinical Goals: Patient's family will work with LCSW to address needs related to level of care Clinical Interventions:  Assessment of needs, barriers , agencies contacted, as well as how impacting  Solution-Focused Strategies employed: caregiver support options  Active listening / Reflection utilized  Caregiver stress acknowledged  Discussed Health Care Power of Attorney  Patient has been approved for Medicaid, waiting on card Has appointment for new photo ID in Feb Collaborated with CAPS coordinator and completed CAPS referral; referral e-mail 10/26/2021 Inter-disciplinary care team collaboration (see longitudinal plan of care) Patient Goals/Self-Care Activities:  I have completed the CAPS referral; you will received the CAPS application packet from [facility] Complete your part and bring other part in for doctor to complete Work on getting current photo ID    Follow up Plan:  Patient's caregiver would like continued follow-up from CCM LCSW .  per caregiver's request CCM LCSW will follow up in 2 weeks.  They will call the office if needed prior to next encounter.  Sammuel Hines, LCSW Care Management & Coordination  [facility] / [employer]   [phone]

## 2021-12-11 ENCOUNTER — Ambulatory Visit: Payer: Medicare (Managed Care) | Admitting: Licensed Clinical Social Worker

## 2021-12-11 DIAGNOSIS — R413 Other amnesia: Secondary | ICD-10-CM

## 2021-12-11 DIAGNOSIS — F1097 Alcohol use, unspecified with alcohol-induced persisting dementia: Secondary | ICD-10-CM

## 2021-12-11 NOTE — Chronic Care Management (AMB) (Signed)
Care Management  Clinical Social Work Note  12/11/2021 Name: Chad Daniel MRN: [id number] DOB: [birth year]  Chad Daniel is a 65 y.o. year old male who is a primary care patient of Derrel Nip, MD. The CCM team was consulted for assistance with care coordination needs. Level of Care Concerns and Caregiver Stress   Consent to Services:  The patient was given information about Care Management services, agreed to services, and gave verbal consent prior to initiation of services.  Please see initial visit note for detailed documentation.   Patient's caregiver agreed to services today and consent obtained.  Engaged with patient's ex-wife Chad Daniel  for follow up visit in response to provider referral for social work care coordination services. Assessed patient's current treatment, progress, coping skills, support system and barriers to care.  Patient's ex-wife Chad Daniel provided all information during this encounter. Collaborated with CAPS coordinator in order to meet patient's ongoing needs. Patient's daughter is currently his caregiver and meeting all ADL.  Would like to continue via CAPS program.  Family declined PCS services at this time, will wait for CAPS .   Assessment includes: Review of patient past medical history, allergies, medications, and health status, including review of pertinent consultant reports was performed as part of comprehensive evaluation and provision of care management/care coordination services. See Care Plan below for interventions and patient self-care actives.   SDOH (Social Determinants of Health) screening and interventions performed today:   Advanced Directives Status:See Care Plan for related entries     Care Plan    Conditions to be addressed/monitored per PCP order: Dementia, Level of care concerns  Care Plan : General Social Work (Adult)  Updates made by Soundra Pilon, LCSW since 12/11/2021 12:00 AM     Problem: No advance directive      Goal:  Effective Long-Term Care Planning   Start Date: 10/09/2021  This Visit's Progress: On track  Recent Progress: On track  Priority: High  Note:   Current barriers:   Unable to complete process without photo ID / photo ID appointment is in Feb Patient does not have an advance directive.  Needs education, support and coordination in order to meet this need. Clinical Goal(s): Over the next 30 days, the patient will review information on advance directive, complete advance directive packet and have notarized.  Interventions: Assessed understanding of Advance Directives. A voluntary discussion about advanced care planning including importance of advanced directives, healthcare proxy and living will was discussed with the patient's ex-wife Chad Daniel.  She was HPOA but unable to locate the paperwork.  Will talk with patient to do new HPOA. Reviewed information on Advance Directive.  Inter-disciplinary care team collaboration (see longitudinal plan of care) Patient Goals/Self-Care Activities :  Review educational information on Advance Directive  Complete Advance Directive packet,  Have advance directive notarized and provide a copy to provider office Call LCSW if you have questions        Problem: Caregiver Stress      Goal: Caregiver Coping Optimized by connecting for community support   Start Date: 10/09/2021  This Visit's Progress: On track  Recent Progress: On track  Priority: High  Note:   Current barriers:    Level of care concerns, ADL IADL limitations, and Memory Deficits Clinical Goals: Patient's family will work with LCSW to address needs related to level of care Clinical Interventions:   Assessment of needs, barriers , agencies contacted, as well as how impacting  Solution-Focused Strategies employed: caregiver support options  Active listening /  Reflection utilized  Caregiver stress acknowledged  Patient has been approved for [employer], Card # provided by Chad Daniel  [id number] R Able to resubmit CAPS referral Collaborated with CAPS coordinator and completed CAPS referral; referral e-mail 12/11/2021 Inter-disciplinary care team collaboration (see longitudinal plan of care) Patient Goals/Self-Care Activities:   you will received the CAPS application packet from [facility] Complete your part and bring other part in for doctor to complete     Follow up Plan:  Patient's caregiver does not require continued follow-up by CCM LCSW. Will contact the office if needed CCM LCSW will disconnect from patient's care team at this time, but will be available at any time they would like to re-engage for care coordination services.   Chad Hines, LCSW Care Management & Coordination  [facility] / [employer]   [phone]

## 2021-12-11 NOTE — Patient Instructions (Signed)
Visit Information  Thank you for taking time to visit with me today. Please don't hesitate to contact me if I can be of assistance to you before our next scheduled telephone appointment.  Following are the goals we discussed today:  Patient Self-Care Activities :  Complete Advance Directive packet,  Have advance directive notarized and provide a copy to provider office Call LCSW if you have questions   you will received the CAPS application packet from [facility] Complete your part and bring other part in for doctor to complete   Please call the care guide team at [phone] if you need to cancel or reschedule your appointment.   If you are experiencing a Mental Health or Behavioral Health Crisis or need someone to talk to, please call 1-800-273-TALK (toll free, 24 hour hotline) go to [facility] [address] [place] [phone]) call 911    No follow up scheduled, per our conversation you do not require continued follow up I will disconnect from your care team at this time, please call the office if additional needs are identified  Patient's caregiver verbalized understanding of instructions, educational materials, and care plan provided today and declined offer to receive copy of patient instructions, educational materials, and care plan.  Sammuel Hines, LCSW Care Management & Coordination  [phone]

## 2022-05-15 ENCOUNTER — Encounter: Payer: Self-pay | Admitting: Family Medicine

## 2022-05-15 ENCOUNTER — Ambulatory Visit: Payer: Medicare (Managed Care) | Admitting: Family Medicine

## 2022-05-15 VITALS — BP 139/101 | HR 77 | Ht 68.0 in | Wt 179.2 lb

## 2022-05-15 DIAGNOSIS — G44209 Tension-type headache, unspecified, not intractable: Secondary | ICD-10-CM | POA: Diagnosis not present

## 2022-05-15 DIAGNOSIS — L608 Other nail disorders: Secondary | ICD-10-CM

## 2022-05-15 DIAGNOSIS — I10 Essential (primary) hypertension: Secondary | ICD-10-CM | POA: Diagnosis not present

## 2022-05-15 MED ORDER — AMLODIPINE BESYLATE 5 MG PO TABS: 5.0000 mg | ORAL_TABLET | Freq: Every day | ORAL | 3 refills | Status: DC

## 2022-05-15 NOTE — Assessment & Plan Note (Addendum)
Unclear etiology for the headaches.  May be related to uncontrolled hypertension.  Could also be related to dehydration.  Discussed supportive care including continuing Tylenol as needed, adequate hydration, prescription for blood pressure medicines.  Discussed red flag symptoms and strict return precautions.

## 2022-05-15 NOTE — Patient Instructions (Signed)
It was great seeing you today!  Regarding your foot concerns I have placed a referral for podiatry and someone should call you to schedule an appointment.  Regarding your headaches I think this is likely multifactorial and related to your blood pressure as well as poor fluid intake throughout the day.  I recommend increasing your water intake and have started you on a medication for your blood pressure.  I would like for you to be seen in approximately 3 weeks so we can recheck your blood pressure and determine if we need to increase the dose.  If you notice symptoms like dizziness when standing please let us know.  I hope you have a wonderful day and we will see you in 3 weeks.

## 2022-05-15 NOTE — Assessment & Plan Note (Signed)
Referral placed for podiatry

## 2022-05-15 NOTE — Assessment & Plan Note (Signed)
Patient with elevated blood pressure today.  Reports she was on medications in the past but discontinued these.  Could be contributing to patient's headaches.  Will prescribe Norvasc 5 mg daily and follow-up in 2 to 3 weeks to titrate medication.

## 2022-05-15 NOTE — Progress Notes (Signed)
    SUBJECTIVE:   CHIEF COMPLAINT / HPI:   Headaches Patient reports that he has had intermittent headaches for the past few months.  He takes Tylenol and the headaches resolved spontaneously.  Reports that the headaches are bitemporal.  Cannot identify any cause of the headaches although he does report he has history of hypertension and has not been taking any medications for this.  Has not been checking his blood pressures.  Also reports that he does not drink much water or fluids in general.  His daughter reports he may drink 1 bottle of water a day if she forces him to do so.  No neurologic symptoms associated with the headaches.  No changes in vision.  Foot concerns Patient presents asking for referral to podiatry.  He has long thick toenails on feet bilaterally and would like assistance caring for his feet.  He has tried to trim them on his own and has been unable to do so because the nails "break".  OBJECTIVE:   BP (!) 139/101   Pulse 77   Ht 5\' 8"  (1.727 m)   Wt 179 lb 3.2 oz (81.3 kg)   SpO2 100%   BMI 27.25 kg/m   General: Pleasant 66 year old male in no acute distress HEENT: Moist mucous membranes, pupils equal and reactive to light, EOM intact Cardiac: Regular rate rhythm Respiratory: No work of breathing, lungs clear to auscultation bilaterally MSK: Patient has considerable thickening of the toenails (see image below) Neuro: Cranial nerves intact, no motor or sensory changes   ASSESSMENT/PLAN:   Essential hypertension Patient with elevated blood pressure today.  Reports she was on medications in the past but discontinued these.  Could be contributing to patient's headaches.  Will prescribe Norvasc 5 mg daily and follow-up in 2 to 3 weeks to titrate medication.  Toenail deformity Referral placed for podiatry.  Tension-type headache, not intractable Unclear etiology for the headaches.  May be related to uncontrolled hypertension.  Could also be related to dehydration.   Discussed supportive care including continuing Tylenol as needed, adequate hydration, prescription for blood pressure medicines.  Discussed red flag symptoms and strict return precautions.     70, MD [facility]

## 2022-05-28 ENCOUNTER — Ambulatory Visit: Payer: Medicare (Managed Care) | Admitting: Podiatry

## 2022-05-29 ENCOUNTER — Encounter: Payer: Self-pay | Admitting: *Deleted

## 2023-03-04 ENCOUNTER — Encounter: Payer: Self-pay | Admitting: Student

## 2023-03-04 ENCOUNTER — Ambulatory Visit (INDEPENDENT_AMBULATORY_CARE_PROVIDER_SITE_OTHER): Payer: Medicare (Managed Care) | Admitting: Student

## 2023-03-04 VITALS — BP 119/86 | HR 65 | Ht 68.0 in | Wt 173.0 lb

## 2023-03-04 DIAGNOSIS — Z1211 Encounter for screening for malignant neoplasm of colon: Secondary | ICD-10-CM | POA: Diagnosis not present

## 2023-03-04 DIAGNOSIS — Z0000 Encounter for general adult medical examination without abnormal findings: Secondary | ICD-10-CM | POA: Diagnosis not present

## 2023-03-04 DIAGNOSIS — Z122 Encounter for screening for malignant neoplasm of respiratory organs: Secondary | ICD-10-CM

## 2023-03-04 NOTE — Progress Notes (Signed)
Annual Wellness Visit     Patient: Chad Daniel, Male    DOB: [birth year], 67 y.o.   MRN: [id number]  Subjective  Chief Complaint  Patient presents with   Annual Exam    Chad Daniel is a 67 y.o. male who presents today for his Annual Wellness Visit.  He is accompanied by his daughter.He reports consuming a general diet. Home exercise routine includes walking 3 hrs per week. He generally feels well. He reports sleeping well. He does not have additional problems to discuss today.   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   amLODipine (NORVASC) 5 MG tablet Take 1 tablet (5 mg total) by mouth at bedtime.   Cyanocobalamin (B-12) 5000 MCG CAPS Please take 1 capsule daily.   terbinafine (LAMISIL) 250 MG tablet Take 1 tablet (250 mg total) by mouth daily.   No facility-administered medications prior to visit.    No Known Allergies  Patient Care Team: Alen Bleacher, MD as PCP - General (Family Medicine) Vickie Epley, MD as PCP - Electrophysiology (Cardiology)  Review of Systems  Constitutional:  Negative for chills, fever and weight loss.  HENT:  Negative for congestion, ear discharge, ear pain, hearing loss and nosebleeds.   Eyes:  Negative for blurred vision, double vision and photophobia.  Respiratory:  Negative for hemoptysis, sputum production and shortness of breath.   Cardiovascular:  Negative for chest pain, palpitations, orthopnea and leg swelling.  Gastrointestinal: Negative.   Genitourinary: Negative.      Objective  BP 119/86   Pulse 65   Ht '5\' 8"'$  (1.727 m)   Wt 173 lb (78.5 kg)   SpO2 99%   BMI 26.30 kg/m   Physical  Exam General: Alert, well appearing, NAD HEENT: Atraumatic, MMM, No sclera icterus CV: RRR, no murmurs, normal S1/S2 Pulm: CTAB, good WOB on RA, no crackles or wheezing Abd: Soft, no distension, no tenderness Skin: dry, warm Ext: No BLE edema, +2 Pedal and radial pulse.     Most recent depression screenings:    03/04/2023    2:07 PM 05/15/2022    9:55 AM  PHQ 2/9 Scores  PHQ - 2 Score 0 3  PHQ- 9 Score 2 12    A score of 3 or more in women, and 4 or more in men indicates increased risk for alcohol abuse, EXCEPT if all of the points are from question 1   Assessment & Plan   Annual wellness visit done today including the all of the following: Reviewed patient's Family Medical History Reviewed and updated list of patient's medical providers Assessment of cognitive impairment was done Assessed patient's functional ability Established a written schedule for health screening [place] Completed and Reviewed  Exercise Activities and Dietary recommendations  Goals      Effective Long-Term Care Planning     Patient Goals/Self-Care Activities : Over the next 30 days Review educational information on Advance Directive  Complete Advance Directive packet,  Have advance directive notarized and provide a copy to provider office Call LCSW if you have questions            Immunization History  Administered Date(s) Administered   Fluad Quad(high Dose 65+) 09/08/2021   Influenza,inj,Quad PF,6+ Mos 11/24/2020   Moderna Sars-Covid-2 Vaccination 03/11/2020, 05/26/2020   PFIZER Comirnaty(Gray Top)Covid-19 Tri-Sucrose Vaccine 04/02/2021   Pfizer Covid-19 Vaccine Bivalent Booster 22yr & up 10/19/2021   Pneumococcal Polysaccharide-23 11/24/2020    Health Maintenance  Topic Date Due   Medicare Annual Wellness (AWV)  Never done   DTaP/Tdap/Td (1 - Tdap) Never done   COLONOSCOPY (Pts 45-446yrInsurance coverage will need to be confirmed)  Never done   Zoster  Vaccines- Shingrix (1 of 2) Never done   Pneumonia Vaccine 6564Years old (2 of 2 - PCV) 11/24/2021   INFLUENZA VACCINE  07/24/2022   COVID-19 Vaccine (5 - 2023-24 season) 08/24/2022   Hepatitis C Screening  Completed   HPV VACCINES  Aged Out        Problem List Items Addressed This Visit       Other   Annual physical exam - Primary    [age]ear old male who is generally healthy other than a history of hypertension that is well-controlled on amlodipine. Fairly active and no medical concerns today.  Encouraged exercise on and smoking cessation.      Relevant Orders   CBC with Differential   Basic Metabolic Panel   Lipid Panel   Other Visit Diagnoses     Encounter for screening for lung cancer       [age]ear old male with over 3029ears of smoking history.  Mentally asymptomatic but appropriate for lung cancer screening.  Will obtain low-dose chest CT.   Relevant Orders   CT CHEST LUNG CA SCREEN LOW DOSE W/O CM   Colon cancer screening       Relevant Orders   Ambulatory referral to Gastroenterology      Follow-up in a year or earlier as needed.  JoAlen BleacherMD

## 2023-03-04 NOTE — Patient Instructions (Addendum)
It was wonderful to see you today. Thank you for allowing me to be a part of your care. Below is a short summary of what we discussed at your visit today:  Today we obtained lab to test for cholesterol, kidney function, electrolyte, and STD.  We will call you if any of these test comes back abnormal.  Encourage stopping smoking.  I have ordered chest CT to screen for lung cancer in tobacco user.  Also I placed a referral to gastroenterologist to get your colonoscopy completed.  Your are due for shingles vaccine which you can get at your pharmacy.  Please bring all of your medications to every appointment!  If you have any questions or concerns, please do not hesitate to contact us via phone or MyChart message.   Alen Bleacher, MD [facility]

## 2023-03-04 NOTE — Assessment & Plan Note (Signed)
67 year old male who is generally healthy other than a history of hypertension that is well-controlled on amlodipine. Fairly active and no medical concerns today.  Encouraged exercise on and smoking cessation.

## 2023-03-05 LAB — CBC WITH DIFFERENTIAL/PLATELET
Basophils Absolute: 0 10*3/uL (ref 0.0–0.2)
Basos: 1 %
EOS (ABSOLUTE): 0.1 10*3/uL (ref 0.0–0.4)
Eos: 2 %
Hematocrit: 39.8 % (ref 37.5–51.0)
Hemoglobin: 13.3 g/dL (ref 13.0–17.7)
Immature Grans (Abs): 0 10*3/uL (ref 0.0–0.1)
Immature Granulocytes: 0 %
Lymphocytes Absolute: 1.6 10*3/uL (ref 0.7–3.1)
Lymphs: 31 %
MCH: 26.8 pg (ref 26.6–33.0)
MCHC: 33.4 g/dL (ref 31.5–35.7)
MCV: 80 fL (ref 79–97)
Monocytes Absolute: 0.4 10*3/uL (ref 0.1–0.9)
Monocytes: 9 %
Neutrophils Absolute: 2.8 10*3/uL (ref 1.4–7.0)
Neutrophils: 57 %
Platelets: 176 10*3/uL (ref 150–450)
RBC: 4.97 x10E6/uL (ref 4.14–5.80)
RDW: 14.2 % (ref 11.6–15.4)
WBC: 4.9 10*3/uL (ref 3.4–10.8)

## 2023-03-05 LAB — BASIC METABOLIC PANEL
BUN/Creatinine Ratio: 13 (ref 10–24)
BUN: 17 mg/dL (ref 8–27)
CO2: 20 mmol/L (ref 20–29)
Calcium: 9.2 mg/dL (ref 8.6–10.2)
Chloride: 104 mmol/L (ref 96–106)
Creatinine, Ser: 1.32 mg/dL — ABNORMAL HIGH (ref 0.76–1.27)
Glucose: 87 mg/dL (ref 70–99)
Potassium: 4.3 mmol/L (ref 3.5–5.2)
Sodium: 141 mmol/L (ref 134–144)
eGFR: 59 mL/min/{1.73_m2} — ABNORMAL LOW (ref >59)

## 2023-03-05 LAB — LIPID PANEL
Chol/HDL Ratio: 4.4 ratio (ref 0.0–5.0)
Cholesterol, Total: 169 mg/dL (ref 100–199)
HDL: 38 mg/dL — ABNORMAL LOW (ref >39)
LDL Chol Calc (NIH): 105 mg/dL — ABNORMAL HIGH (ref 0–99)
Triglycerides: 147 mg/dL (ref 0–149)
VLDL Cholesterol Cal: 26 mg/dL (ref 5–40)

## 2023-04-02 ENCOUNTER — Ambulatory Visit
Admission: RE | Admit: 2023-04-02 | Discharge: 2023-04-02 | Disposition: A | Discharge status: Home / Self Care | Payer: Medicare (Managed Care) | Source: Ambulatory Visit | Attending: Family Medicine | Admitting: Family Medicine

## 2023-04-02 DIAGNOSIS — Z122 Encounter for screening for malignant neoplasm of respiratory organs: Secondary | ICD-10-CM

## 2023-04-24 ENCOUNTER — Encounter: Payer: Self-pay | Admitting: Student

## 2023-04-24 ENCOUNTER — Other Ambulatory Visit: Payer: Self-pay | Admitting: Family Medicine

## 2023-05-02 ENCOUNTER — Other Ambulatory Visit: Payer: Self-pay | Admitting: Student

## 2023-05-02 DIAGNOSIS — I10 Essential (primary) hypertension: Secondary | ICD-10-CM

## 2023-05-02 MED ORDER — AMLODIPINE BESYLATE 5 MG PO TABS
ORAL_TABLET | ORAL | 3 refills | Status: AC
Start: 2023-05-02 — End: ?

## 2023-05-02 NOTE — Progress Notes (Signed)
Refilled Amlodipine.

## 2023-06-11 ENCOUNTER — Ambulatory Visit (INDEPENDENT_AMBULATORY_CARE_PROVIDER_SITE_OTHER): Payer: Medicare (Managed Care)

## 2023-06-11 NOTE — Patient Instructions (Signed)
Health Maintenance, Male Adopting a healthy lifestyle and getting preventive care are important in promoting health and wellness. Ask your health care provider about: The right schedule for you to have regular tests and exams. Things you can do on your own to prevent diseases and keep yourself healthy. What should I know about diet, weight, and exercise? Eat a healthy diet  Eat a diet that includes plenty of vegetables, fruits, low-fat dairy products, and lean protein. Do not eat a lot of foods that are high in solid fats, added sugars, or sodium. Maintain a healthy weight Body mass index (BMI) is a measurement that can be used to identify possible weight problems. It estimates body fat based on height and weight. Your health care provider can help determine your BMI and help you achieve or maintain a healthy weight. Get regular exercise Get regular exercise. This is one of the most important things you can do for your health. Most adults should: Exercise for at least 150 minutes each week. The exercise should increase your heart rate and make you sweat (moderate-intensity exercise). Do strengthening exercises at least twice a week. This is in addition to the moderate-intensity exercise. Spend less time sitting. Even light physical activity can be beneficial. Watch cholesterol and blood lipids Have your blood tested for lipids and cholesterol at 68 years of age, then have this test every 5 years. You may need to have your cholesterol levels checked more often if: Your lipid or cholesterol levels are high. You are older than 68 years of age. You are at high risk for heart disease. What should I know about cancer screening? Many types of cancers can be detected early and may often be prevented. Depending on your health history and family history, you may need to have cancer screening at various ages. This may include screening for: Colorectal cancer. Prostate cancer. Skin cancer. Lung  cancer. What should I know about heart disease, diabetes, and high blood pressure? Blood pressure and heart disease High blood pressure causes heart disease and increases the risk of stroke. This is more likely to develop in people who have high blood pressure readings or are overweight. Talk with your health care provider about your target blood pressure readings. Have your blood pressure checked: Every 3-5 years if you are 18-39 years of age. Every year if you are 40 years old or older. If you are between the ages of 65 and 75 and are a current or former smoker, ask your health care provider if you should have a one-time screening for abdominal aortic aneurysm (AAA). Diabetes Have regular diabetes screenings. This checks your fasting blood sugar level. Have the screening done: Once every three years after age 45 if you are at a normal weight and have a low risk for diabetes. More often and at a younger age if you are overweight or have a high risk for diabetes. What should I know about preventing infection? Hepatitis B If you have a higher risk for hepatitis B, you should be screened for this virus. Talk with your health care provider to find out if you are at risk for hepatitis B infection. Hepatitis C Blood testing is recommended for: Everyone born from 1945 through 1965. Anyone with known risk factors for hepatitis C. Sexually transmitted infections (STIs) You should be screened each year for STIs, including gonorrhea and chlamydia, if: You are sexually active and are younger than 68 years of age. You are older than 68 years of age and your   health care provider tells you that you are at risk for this type of infection. Your sexual activity has changed since you were last screened, and you are at increased risk for chlamydia or gonorrhea. Ask your health care provider if you are at risk. Ask your health care provider about whether you are at high risk for HIV. Your health care provider  may recommend a prescription medicine to help prevent HIV infection. If you choose to take medicine to prevent HIV, you should first get tested for HIV. You should then be tested every 3 months for as long as you are taking the medicine. Follow these instructions at home: Alcohol use Do not drink alcohol if your health care provider tells you not to drink. If you drink alcohol: Limit how much you have to 0-2 drinks a day. Know how much alcohol is in your drink. In the [place] one drink equals one 12 oz bottle of beer (355 mL), one 5 oz glass of wine (148 mL), or one 1 oz glass of hard liquor (44 mL). Lifestyle Do not use any products that contain nicotine or tobacco. These products include cigarettes, chewing tobacco, and vaping devices, such as e-cigarettes. If you need help quitting, ask your health care provider. Do not use street drugs. Do not share needles. Ask your health care provider for help if you need support or information about quitting drugs. General instructions Schedule regular health, dental, and eye exams. Stay current with your vaccines. Tell your health care provider if: You often feel depressed. You have ever been abused or do not feel safe at home. Summary Adopting a healthy lifestyle and getting preventive care are important in promoting health and wellness. Follow your health care provider's instructions about healthy diet, exercising, and getting tested or screened for diseases. Follow your health care provider's instructions on monitoring your cholesterol and blood pressure. This information is not intended to replace advice given to you by your health care provider. Make sure you discuss any questions you have with your health care provider. Document Revised: 05/01/2021 Document Reviewed: 05/01/2021 Elsevier Patient Education  2024 [employer]  

## 2023-06-11 NOTE — Progress Notes (Signed)
  I attempted to contact the patient for her scheduled Virtual Telephone Annual Wellness Visit. No answer. I left a detailed message on the patient's voicemail.
